# Patient Record
Sex: Female | Born: 1990 | Race: Black or African American | Hispanic: No | Marital: Single | Smoking: Current every day smoker
Health system: Southern US, Community
[De-identification: ages and names within clinical notes are randomized; demographics above are authoritative.]

## PROBLEM LIST (undated history)

## (undated) DIAGNOSIS — F329 Major depressive disorder, single episode, unspecified: Secondary | ICD-10-CM

## (undated) DIAGNOSIS — F32A Depression, unspecified: Secondary | ICD-10-CM

## (undated) DIAGNOSIS — T8859XA Other complications of anesthesia, initial encounter: Secondary | ICD-10-CM

## (undated) DIAGNOSIS — T4145XA Adverse effect of unspecified anesthetic, initial encounter: Secondary | ICD-10-CM

## (undated) DIAGNOSIS — B009 Herpesviral infection, unspecified: Secondary | ICD-10-CM

## (undated) DIAGNOSIS — Z8619 Personal history of other infectious and parasitic diseases: Secondary | ICD-10-CM

## (undated) HISTORY — DX: Personal history of other infectious and parasitic diseases: Z86.19

## (undated) HISTORY — PX: WISDOM TOOTH EXTRACTION: SHX21

---
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
## 2009-07-12 ENCOUNTER — Emergency Department (HOSPITAL_COMMUNITY): Admission: EM | Admit: 2009-07-12 | Discharge: 2009-07-12 | Payer: Self-pay | Admitting: Emergency Medicine

## 2009-07-24 ENCOUNTER — Other Ambulatory Visit: Payer: Self-pay

## 2009-07-25 ENCOUNTER — Other Ambulatory Visit: Payer: Self-pay | Admitting: Emergency Medicine

## 2009-07-25 ENCOUNTER — Inpatient Hospital Stay (HOSPITAL_COMMUNITY): Admission: AD | Admit: 2009-07-25 | Discharge: 2009-07-25 | Payer: Self-pay | Admitting: Obstetrics & Gynecology

## 2009-07-25 ENCOUNTER — Ambulatory Visit: Payer: Self-pay | Admitting: Obstetrics and Gynecology

## 2009-08-22 ENCOUNTER — Other Ambulatory Visit: Payer: Self-pay | Admitting: Emergency Medicine

## 2009-08-22 ENCOUNTER — Inpatient Hospital Stay (HOSPITAL_COMMUNITY): Admission: AD | Admit: 2009-08-22 | Discharge: 2009-08-22 | Payer: Self-pay | Admitting: Obstetrics & Gynecology

## 2009-08-22 ENCOUNTER — Ambulatory Visit: Payer: Self-pay | Admitting: Family Medicine

## 2009-09-06 ENCOUNTER — Emergency Department (HOSPITAL_COMMUNITY): Admission: EM | Admit: 2009-09-06 | Discharge: 2009-09-06 | Payer: Self-pay | Admitting: Emergency Medicine

## 2009-09-17 ENCOUNTER — Inpatient Hospital Stay (HOSPITAL_COMMUNITY): Admission: AD | Admit: 2009-09-17 | Discharge: 2009-09-17 | Payer: Self-pay | Admitting: Obstetrics and Gynecology

## 2009-10-05 ENCOUNTER — Inpatient Hospital Stay (HOSPITAL_COMMUNITY): Admission: AD | Admit: 2009-10-05 | Discharge: 2009-10-08 | Payer: Self-pay | Admitting: Family Medicine

## 2009-10-05 ENCOUNTER — Other Ambulatory Visit: Payer: Self-pay | Admitting: Emergency Medicine

## 2009-10-14 ENCOUNTER — Other Ambulatory Visit: Payer: Self-pay | Admitting: Emergency Medicine

## 2009-10-14 ENCOUNTER — Inpatient Hospital Stay (HOSPITAL_COMMUNITY): Admission: AD | Admit: 2009-10-14 | Discharge: 2009-10-16 | Payer: Self-pay | Admitting: Family Medicine

## 2009-10-14 ENCOUNTER — Ambulatory Visit: Payer: Self-pay | Admitting: Family Medicine

## 2010-06-03 ENCOUNTER — Emergency Department (HOSPITAL_COMMUNITY)
Admission: EM | Admit: 2010-06-03 | Discharge: 2010-06-03 | Payer: Self-pay | Source: Home / Self Care | Admitting: Emergency Medicine

## 2010-06-12 ENCOUNTER — Emergency Department (HOSPITAL_COMMUNITY)
Admission: EM | Admit: 2010-06-12 | Discharge: 2010-06-12 | Payer: Self-pay | Source: Home / Self Care | Admitting: Emergency Medicine

## 2010-06-13 LAB — URINALYSIS, ROUTINE W REFLEX MICROSCOPIC
Bilirubin Urine: NEGATIVE
Hgb urine dipstick: NEGATIVE
Ketones, ur: NEGATIVE mg/dL
Leukocytes, UA: NEGATIVE
Nitrite: NEGATIVE
Protein, ur: 100 mg/dL — AB
Specific Gravity, Urine: >1.03 — ABNORMAL HIGH (ref 1.005–1.030)
Urine Glucose, Fasting: NEGATIVE mg/dL
Urobilinogen, UA: 0.2 mg/dL (ref 0.0–1.0)
pH: 6 (ref 5.0–8.0)

## 2010-06-13 LAB — URINE MICROSCOPIC-ADD ON

## 2010-06-13 LAB — PREGNANCY, URINE: Preg Test, Ur: NEGATIVE

## 2010-08-06 LAB — CBC
HCT: 28 % — ABNORMAL LOW (ref 36.0–46.0)
HCT: 28.9 % — ABNORMAL LOW (ref 36.0–46.0)
Hemoglobin: 9.4 g/dL — ABNORMAL LOW (ref 12.0–15.0)
Hemoglobin: 9.9 g/dL — ABNORMAL LOW (ref 12.0–15.0)
MCHC: 35.7 g/dL (ref 30.0–36.0)
MCV: 93.6 fL (ref 78.0–100.0)
Platelets: 147 10*3/uL — ABNORMAL LOW (ref 150–400)
Platelets: 154 10*3/uL (ref 150–400)
Platelets: 311 10*3/uL (ref 150–400)
RBC: 3.28 MIL/uL — ABNORMAL LOW (ref 3.87–5.11)
RBC: 2.59 MIL/uL — ABNORMAL LOW (ref 3.87–5.11)
RBC: 3 MIL/uL — ABNORMAL LOW (ref 3.87–5.11)
RDW: 15.9 % — ABNORMAL HIGH (ref 11.5–15.5)
RDW: 16.5 % — ABNORMAL HIGH (ref 11.5–15.5)
WBC: 12.2 10*3/uL — ABNORMAL HIGH (ref 4.0–10.5)
WBC: 11.4 10*3/uL — ABNORMAL HIGH (ref 4.0–10.5)
WBC: 14.1 10*3/uL — ABNORMAL HIGH (ref 4.0–10.5)
WBC: 9.8 10*3/uL (ref 4.0–10.5)

## 2010-08-06 LAB — URINE MICROSCOPIC-ADD ON

## 2010-08-06 LAB — COMPREHENSIVE METABOLIC PANEL
ALT: 61 U/L — ABNORMAL HIGH (ref 0–35)
ALT: 41 U/L — ABNORMAL HIGH (ref 0–35)
ALT: 49 U/L — ABNORMAL HIGH (ref 0–35)
AST: 59 U/L — ABNORMAL HIGH (ref 0–37)
AST: 37 U/L (ref 0–37)
AST: 51 U/L — ABNORMAL HIGH (ref 0–37)
Albumin: 3.2 g/dL — ABNORMAL LOW (ref 3.5–5.2)
Albumin: 2.5 g/dL — ABNORMAL LOW (ref 3.5–5.2)
Alkaline Phosphatase: 102 U/L (ref 39–117)
Alkaline Phosphatase: 104 U/L (ref 39–117)
CO2: 24 mEq/L (ref 19–32)
CO2: 21 mEq/L (ref 19–32)
Calcium: 7 mg/dL — ABNORMAL LOW (ref 8.4–10.5)
Chloride: 110 mEq/L (ref 96–112)
Chloride: 108 mEq/L (ref 96–112)
Creatinine, Ser: 0.6 mg/dL (ref 0.4–1.2)
GFR calc Af Amer: >60 mL/min (ref >60)
GFR calc Af Amer: >60 mL/min (ref >60)
GFR calc Af Amer: >60 mL/min (ref >60)
GFR calc non Af Amer: >60 mL/min (ref >60)
GFR calc non Af Amer: >60 mL/min (ref >60)
Potassium: 4 mEq/L (ref 3.5–5.1)
Potassium: 3.6 mEq/L (ref 3.5–5.1)
Potassium: 3.6 mEq/L (ref 3.5–5.1)
Sodium: 139 mEq/L (ref 135–145)
Sodium: 137 mEq/L (ref 135–145)
Total Bilirubin: 0.7 mg/dL (ref 0.3–1.2)
Total Bilirubin: 0.5 mg/dL (ref 0.3–1.2)
Total Protein: 5.6 g/dL — ABNORMAL LOW (ref 6.0–8.3)

## 2010-08-06 LAB — URINALYSIS, ROUTINE W REFLEX MICROSCOPIC
Glucose, UA: NEGATIVE mg/dL
Glucose, UA: NEGATIVE mg/dL
Hgb urine dipstick: NEGATIVE
Ketones, ur: NEGATIVE mg/dL
Protein, ur: 100 mg/dL — AB
Protein, ur: 30 mg/dL — AB
Specific Gravity, Urine: >1.03 — ABNORMAL HIGH (ref 1.005–1.030)
pH: 6 (ref 5.0–8.0)
pH: 7 (ref 5.0–8.0)

## 2010-08-07 LAB — URINALYSIS, ROUTINE W REFLEX MICROSCOPIC
Glucose, UA: NEGATIVE mg/dL
Hgb urine dipstick: NEGATIVE
Protein, ur: NEGATIVE mg/dL
pH: 7 (ref 5.0–8.0)

## 2010-08-07 LAB — WET PREP, GENITAL
Trich, Wet Prep: NONE SEEN
Yeast Wet Prep HPF POC: NONE SEEN

## 2010-08-08 LAB — COMPREHENSIVE METABOLIC PANEL
ALT: 10 U/L (ref 0–35)
BUN: 6 mg/dL (ref 6–23)
CO2: 24 mEq/L (ref 19–32)
Calcium: 9.3 mg/dL (ref 8.4–10.5)
Creatinine, Ser: 0.5 mg/dL (ref 0.4–1.2)
GFR calc non Af Amer: >60 mL/min (ref >60)
Glucose, Bld: 84 mg/dL (ref 70–99)
Total Protein: 6.2 g/dL (ref 6.0–8.3)

## 2010-08-08 LAB — GC/CHLAMYDIA PROBE AMP, GENITAL
Chlamydia, DNA Probe: NEGATIVE
GC Probe Amp, Genital: NEGATIVE

## 2010-08-08 LAB — URINE CULTURE: Colony Count: 15000

## 2010-08-08 LAB — URINALYSIS, ROUTINE W REFLEX MICROSCOPIC
Glucose, UA: NEGATIVE mg/dL
Hgb urine dipstick: NEGATIVE
Ketones, ur: NEGATIVE mg/dL
Leukocytes, UA: NEGATIVE
Urobilinogen, UA: 0.2 mg/dL (ref 0.0–1.0)

## 2010-08-08 LAB — CBC
Hemoglobin: 10.6 g/dL — ABNORMAL LOW (ref 12.0–15.0)
MCHC: 35.2 g/dL (ref 30.0–36.0)
MCV: 94.1 fL (ref 78.0–100.0)
RBC: 3.2 MIL/uL — ABNORMAL LOW (ref 3.87–5.11)
RDW: 14.2 % (ref 11.5–15.5)

## 2010-08-08 LAB — RAPID STREP SCREEN (MED CTR MEBANE ONLY): Streptococcus, Group A Screen (Direct): NEGATIVE

## 2010-08-08 LAB — DIFFERENTIAL
Eosinophils Absolute: 0 10*3/uL (ref 0.0–0.7)
Lymphocytes Relative: 17 % (ref 12–46)
Lymphs Abs: 1.9 10*3/uL (ref 0.7–4.0)
Monocytes Relative: 5 % (ref 3–12)
Neutro Abs: 8.5 10*3/uL — ABNORMAL HIGH (ref 1.7–7.7)
Neutrophils Relative %: 77 % (ref 43–77)

## 2010-08-08 LAB — URINE MICROSCOPIC-ADD ON

## 2010-08-08 LAB — WET PREP, GENITAL

## 2010-08-13 LAB — GC/CHLAMYDIA PROBE AMP, GENITAL
Chlamydia, DNA Probe: NEGATIVE
GC Probe Amp, Genital: NEGATIVE

## 2010-08-13 LAB — WET PREP, GENITAL: Yeast Wet Prep HPF POC: NONE SEEN

## 2010-08-13 LAB — URINALYSIS, ROUTINE W REFLEX MICROSCOPIC
Hgb urine dipstick: NEGATIVE
Nitrite: NEGATIVE
Protein, ur: NEGATIVE mg/dL
Urobilinogen, UA: 1 mg/dL (ref 0.0–1.0)

## 2010-08-27 LAB — GC/CHLAMYDIA PROBE AMP, GENITAL: Chlamydia, DNA Probe: NEGATIVE

## 2010-08-27 LAB — DIFFERENTIAL
Lymphocytes Relative: 42 % (ref 12–46)
Lymphs Abs: 3.8 10*3/uL (ref 0.7–4.0)
Monocytes Relative: 6 % (ref 3–12)
Neutro Abs: 4.7 10*3/uL (ref 1.7–7.7)
Neutrophils Relative %: 52 % (ref 43–77)

## 2010-08-27 LAB — URINALYSIS, ROUTINE W REFLEX MICROSCOPIC
Bilirubin Urine: NEGATIVE
Glucose, UA: NEGATIVE mg/dL
Hgb urine dipstick: NEGATIVE
Protein, ur: NEGATIVE mg/dL
Specific Gravity, Urine: >1.03 — ABNORMAL HIGH (ref 1.005–1.030)
Specific Gravity, Urine: >1.03 — ABNORMAL HIGH (ref 1.005–1.030)
Urobilinogen, UA: 0.2 mg/dL (ref 0.0–1.0)
Urobilinogen, UA: 0.2 mg/dL (ref 0.0–1.0)

## 2010-08-27 LAB — WET PREP, GENITAL
WBC, Wet Prep HPF POC: NONE SEEN
Yeast Wet Prep HPF POC: NONE SEEN

## 2010-08-27 LAB — BASIC METABOLIC PANEL
Calcium: 9.2 mg/dL (ref 8.4–10.5)
Creatinine, Ser: 0.73 mg/dL (ref 0.4–1.2)
GFR calc Af Amer: >60 mL/min (ref >60)

## 2010-08-27 LAB — CBC
RBC: 3.66 MIL/uL — ABNORMAL LOW (ref 3.87–5.11)
WBC: 9.1 10*3/uL (ref 4.0–10.5)

## 2010-08-29 LAB — URINALYSIS, ROUTINE W REFLEX MICROSCOPIC
Glucose, UA: NEGATIVE mg/dL
Hgb urine dipstick: NEGATIVE
Protein, ur: NEGATIVE mg/dL
Specific Gravity, Urine: 1.025 (ref 1.005–1.030)
pH: 6 (ref 5.0–8.0)

## 2010-08-29 LAB — HERPES SIMPLEX VIRUS CULTURE

## 2010-08-29 LAB — WET PREP, GENITAL
Trich, Wet Prep: NONE SEEN
Yeast Wet Prep HPF POC: NONE SEEN

## 2010-11-17 ENCOUNTER — Emergency Department (HOSPITAL_COMMUNITY): Payer: Self-pay

## 2010-11-17 ENCOUNTER — Emergency Department (HOSPITAL_COMMUNITY)
Admission: EM | Admit: 2010-11-17 | Discharge: 2010-11-17 | Disposition: A | Discharge status: Home / Self Care | Payer: Self-pay | Attending: Emergency Medicine | Admitting: Emergency Medicine

## 2010-11-17 DIAGNOSIS — S1093XA Contusion of unspecified part of neck, initial encounter: Secondary | ICD-10-CM | POA: Insufficient documentation

## 2010-11-17 DIAGNOSIS — S0990XA Unspecified injury of head, initial encounter: Secondary | ICD-10-CM | POA: Insufficient documentation

## 2010-11-17 DIAGNOSIS — S0003XA Contusion of scalp, initial encounter: Secondary | ICD-10-CM | POA: Insufficient documentation

## 2010-11-19 ENCOUNTER — Emergency Department (HOSPITAL_COMMUNITY)
Admission: EM | Admit: 2010-11-19 | Discharge: 2010-11-19 | Disposition: A | Discharge status: Home / Self Care | Payer: Self-pay | Attending: Emergency Medicine | Admitting: Emergency Medicine

## 2010-11-19 DIAGNOSIS — R51 Headache: Secondary | ICD-10-CM | POA: Insufficient documentation

## 2011-02-22 LAB — URINALYSIS, ROUTINE W REFLEX MICROSCOPIC
Ketones, ur: NEGATIVE mg/dL
Leukocytes, UA: NEGATIVE
Nitrite: NEGATIVE
pH: 5.5 (ref 5.0–8.0)

## 2011-02-22 LAB — URINE MICROSCOPIC-ADD ON

## 2011-02-22 LAB — PREGNANCY, URINE: Preg Test, Ur: NEGATIVE

## 2011-06-25 ENCOUNTER — Emergency Department (HOSPITAL_COMMUNITY)
Admission: EM | Admit: 2011-06-25 | Discharge: 2011-06-25 | Disposition: A | Discharge status: Home / Self Care | Payer: Self-pay | Attending: Emergency Medicine | Admitting: Emergency Medicine

## 2011-06-25 ENCOUNTER — Encounter (HOSPITAL_COMMUNITY): Payer: Self-pay | Admitting: *Deleted

## 2011-06-25 DIAGNOSIS — F17200 Nicotine dependence, unspecified, uncomplicated: Secondary | ICD-10-CM | POA: Insufficient documentation

## 2011-06-25 DIAGNOSIS — L309 Dermatitis, unspecified: Secondary | ICD-10-CM

## 2011-06-25 DIAGNOSIS — L259 Unspecified contact dermatitis, unspecified cause: Secondary | ICD-10-CM | POA: Insufficient documentation

## 2011-06-25 DIAGNOSIS — K649 Unspecified hemorrhoids: Secondary | ICD-10-CM | POA: Insufficient documentation

## 2011-06-25 HISTORY — DX: Herpesviral infection, unspecified: B00.9

## 2011-06-25 MED ORDER — HYDROCORTISONE ACETATE 25 MG RE SUPP: 25.0000 mg | Freq: Two times a day (BID) | RECTAL | Status: AC

## 2011-06-25 MED ORDER — HYDROXYZINE PAMOATE 25 MG PO CAPS: ORAL_CAPSULE | ORAL | Status: DC

## 2011-06-25 MED ORDER — TRIAMCINOLONE ACETONIDE 0.1 % EX CREA: TOPICAL_CREAM | Freq: Two times a day (BID) | CUTANEOUS | Status: DC

## 2011-06-25 MED ORDER — DEXAMETHASONE 4 MG PO TABS: ORAL_TABLET | ORAL | Status: AC

## 2011-06-25 NOTE — ED Notes (Signed)
Pt reports rectal pain x 2 days with hemorrhoids, reports hemorrhoids "busted" last night and has had bleeding since.  Also c/o ? Scabies, reports rash to hands for "a while" states son has been dx with same.

## 2011-06-25 NOTE — ED Notes (Signed)
Patient c/o rectal pain. Per patient hemorhroids x 2 days. Will wait to assess with EDPA

## 2011-07-11 NOTE — ED Provider Notes (Signed)
History     CSN: [id number]  Arrival date & time 06/25/11  4098   First MD Initiated Contact with Patient 06/25/11 [phone]      Chief Complaint  Patient presents with  . Rectal Pain  . Rash    (Consider location/radiation/quality/duration/timing/severity/associated sxs/prior treatment) HPI Comments: Patient presents to the emergency department with complaint of 2 days of rectal pain. The patient states he has had problems with hemorrhoids, and feels that one of them" busted" on last evening states he has had some bleeding from the rectal area since that time. The patient also complains of a rash on his hands that he has had for" a while" and he requested this be checked as well. It is of note that his son was diagnosed with" scabies".  Patient is a 20 y.o. female presenting with rash. The history is provided by the patient.  Rash     Past Medical History  Diagnosis Date  . Herpes     Past Surgical History  Procedure Date  . Wisdom tooth extraction     No family history on file.  History  Substance Use Topics  . Smoking status: Current Everyday Smoker    Types: Cigarettes  . Smokeless tobacco: Not on file  . Alcohol Use: No    OB History    Grav Para Term Preterm Abortions TAB SAB Ect Mult Living                  Review of Systems  Constitutional: Negative for activity change.       All ROS Neg except as noted in HPI  HENT: Negative for nosebleeds and neck pain.   Eyes: Negative for photophobia and discharge.  Respiratory: Negative for cough, shortness of breath and wheezing.   Cardiovascular: Negative for chest pain and palpitations.  Gastrointestinal: Positive for rectal pain. Negative for abdominal pain and blood in stool.  Genitourinary: Negative for dysuria, frequency and hematuria.  Musculoskeletal: Negative for back pain and arthralgias.  Skin: Positive for rash.  Neurological: Negative for dizziness, seizures and speech difficulty.    Psychiatric/Behavioral: Negative for hallucinations and confusion.    Allergies  Review of patient's allergies indicates no known allergies.  Home Medications   Current Outpatient Rx  Name Route Sig Dispense Refill  . HYDROCORTISONE 1 % EX CREA Topical Apply 1 application topically 2 (two) times daily.    Marland Kitchen LEVONORGESTREL 20 MCG/24HR IU IUD Intrauterine 1 each by Intrauterine route once.    Marland Kitchen HYDROXYZINE PAMOATE 25 MG PO CAPS  1 po qhs for itching 10 capsule 0  . TRIAMCINOLONE ACETONIDE 0.1 % EX CREA Topical Apply topically 2 (two) times daily. Apply to itching areas 2 times daily 45 g 0    BP 100/80  Pulse 67  Temp(Src) 97.9 F (36.6 C) (Oral)  Resp 16  Ht 5\' 1"  (1.549 m)  Wt 125 lb (56.7 kg)  BMI 23.62 kg/m2  SpO2 100%  Physical Exam  Nursing note and vitals reviewed. Constitutional: She is oriented to person, place, and time. She appears well-developed and well-nourished.  Non-toxic appearance.  HENT:  Head: Normocephalic.  Right Ear: Tympanic membrane and external ear normal.  Left Ear: Tympanic membrane and external ear normal.  Eyes: EOM and lids are normal. Pupils are equal, round, and reactive to light.  Neck: Normal range of motion. Neck supple. Carotid bruit is not present.  Cardiovascular: Normal rate, regular rhythm, normal heart sounds, intact distal pulses and normal pulses.  Pulmonary/Chest: Breath sounds normal. No respiratory distress.  Abdominal: Soft. Bowel sounds are normal. There is no tenderness. There is no guarding.  Genitourinary:       External hemorrhoid noted on rectal exam, With mild bleeding present. Good sphincter tone. No mass involving the sphincter or rectal vault.  Musculoskeletal: Normal range of motion.  Lymphadenopathy:       Head (right side): No submandibular adenopathy present.       Head (left side): No submandibular adenopathy present.    She has no cervical adenopathy.  Neurological: She is alert and oriented to person,  place, and time. She has normal strength. No cranial nerve deficit or sensory deficit.  Skin: Skin is warm and dry. Rash noted.       Rash noted to the hands. There are no lesions between the fingers or the web spaces. And there is no rash above the wrist.  Psychiatric: She has a normal mood and affect. Her speech is normal.    ED Course  Procedures (including critical care time)  Labs Reviewed - No data to display No results found.   1. Hemorrhoids   2. Eczema       MDM  I have reviewed nursing notes, vital signs, and all appropriate lab and imaging results for this patient.  Rx for vistaril and Kenalog given to pt. Pt is use warm tub soaks for his hemorrhoid, No evidence of thrombosis noted.      Kathie Dike, [place] 07/11/11 [phone]

## 2011-07-12 NOTE — ED Provider Notes (Signed)
Medical screening examination/treatment/procedure(s) were performed by non-physician practitioner and as supervising physician I was immediately available for consultation/collaboration.   Laray Anger, DO 07/12/11 1333

## 2011-09-17 ENCOUNTER — Emergency Department (HOSPITAL_COMMUNITY)
Admission: EM | Admit: 2011-09-17 | Discharge: 2011-09-17 | Disposition: A | Discharge status: Home / Self Care | Payer: Self-pay | Attending: Emergency Medicine | Admitting: Emergency Medicine

## 2011-09-17 ENCOUNTER — Encounter (HOSPITAL_COMMUNITY): Payer: Self-pay | Admitting: *Deleted

## 2011-09-17 DIAGNOSIS — Z202 Contact with and (suspected) exposure to infections with a predominantly sexual mode of transmission: Secondary | ICD-10-CM | POA: Insufficient documentation

## 2011-09-17 DIAGNOSIS — B009 Herpesviral infection, unspecified: Secondary | ICD-10-CM | POA: Insufficient documentation

## 2011-09-17 LAB — URINALYSIS, ROUTINE W REFLEX MICROSCOPIC
Glucose, UA: NEGATIVE mg/dL
Leukocytes, UA: NEGATIVE
Protein, ur: NEGATIVE mg/dL
Specific Gravity, Urine: 1.015 (ref 1.005–1.030)
Urobilinogen, UA: 0.2 mg/dL (ref 0.0–1.0)

## 2011-09-17 LAB — POCT PREGNANCY, URINE: Preg Test, Ur: NEGATIVE

## 2011-09-17 LAB — WET PREP, GENITAL: Trich, Wet Prep: NONE SEEN

## 2011-09-17 MED ORDER — CEFTRIAXONE SODIUM 250 MG IJ SOLR
250.0000 mg | Freq: Once | INTRAMUSCULAR | Status: AC
  Administered 2011-09-17: 250 mg via INTRAMUSCULAR
  Filled 2011-09-17: qty 250

## 2011-09-17 MED ORDER — AZITHROMYCIN 250 MG PO TABS
1000.0000 mg | ORAL_TABLET | Freq: Once | ORAL | Status: AC
  Administered 2011-09-17: 1000 mg via ORAL
  Filled 2011-09-17: qty 4

## 2011-09-17 MED ORDER — METRONIDAZOLE 500 MG PO TABS: 500.0000 mg | ORAL_TABLET | Freq: Two times a day (BID) | ORAL | Status: AC

## 2011-09-17 MED ORDER — LIDOCAINE HCL (PF) 1 % IJ SOLN
INTRAMUSCULAR | Status: AC
  Administered 2011-09-17: 22:00:00
  Filled 2011-09-17: qty 5

## 2011-09-17 NOTE — ED Notes (Signed)
Exposed to std.

## 2011-09-17 NOTE — ED Notes (Signed)
Pt presents with need to to tested for STD's. Pt reports ex-boyfriend confessed his infidelity today and his positive test results for std's. Pt denies vaginal discharge,odor and abdominal discomfort. Pt is very upset and anxious for test results.

## 2011-09-20 NOTE — ED Provider Notes (Signed)
History     CSN: [id number]  Arrival date & time 09/17/11  4098   First MD Initiated Contact with Patient 09/17/11 2003      Chief Complaint  Patient presents with  . SEXUALLY TRANSMITTED DISEASE    (Consider location/radiation/quality/duration/timing/severity/associated sxs/prior treatment) HPI Comments: Patient is requesting STD check.  She states that her recent sexual partner has been treated for an STD.  She is unsure of which one , but thinks he has chlamydia.  Patient denies any symptoms at this time.  States her last sexual encounter with him was two weeks ago.    Patient is a 21 y.o. female presenting with STD exposure. The history is provided by the patient.  Exposure to STD This is a new problem. The current episode started today. Pertinent negatives include no abdominal pain, arthralgias, chills, fever, myalgias, nausea, numbness, rash, sore throat, swollen glands, urinary symptoms, vomiting or weakness. The symptoms are aggravated by nothing. She has tried nothing for the symptoms.    Past Medical History  Diagnosis Date  . Herpes     Past Surgical History  Procedure Date  . Wisdom tooth extraction     No family history on file.  History  Substance Use Topics  . Smoking status: Current Everyday Smoker    Types: Cigarettes  . Smokeless tobacco: Not on file  . Alcohol Use: No    OB History    Grav Para Term Preterm Abortions TAB SAB Ect Mult Living                  Review of Systems  Constitutional: Negative for fever and chills.  HENT: Negative for sore throat and trouble swallowing.   Respiratory: Negative for shortness of breath.   Gastrointestinal: Negative for nausea, vomiting and abdominal pain.  Genitourinary: Negative for dysuria, frequency, hematuria, flank pain, decreased urine volume, vaginal bleeding, vaginal discharge, difficulty urinating, menstrual problem and pelvic pain.  Musculoskeletal: Negative for myalgias, back pain and  arthralgias.  Skin: Negative for rash.  Neurological: Negative for weakness and numbness.  Hematological: Does not bruise/bleed easily.  All other systems reviewed and are negative.    Allergies  Review of patient's allergies indicates no known allergies.  Home Medications   Current Outpatient Rx  Name Route Sig Dispense Refill  . LEVONORGESTREL 20 MCG/24HR IU IUD Intrauterine 1 each by Intrauterine route once.    Marland Kitchen METRONIDAZOLE 500 MG PO TABS Oral Take 1 tablet (500 mg total) by mouth 2 (two) times daily. For 7 days 14 tablet 0    BP 131/65  Pulse 63  Temp 98.1 F (36.7 C)  Resp 20  Ht 5\' 1"  (1.549 m)  Wt 126 lb (57.153 kg)  BMI 23.81 kg/m2  SpO2 99%  Physical Exam  Nursing note and vitals reviewed. Constitutional: She is oriented to person, place, and time. She appears well-developed and well-nourished. No distress.  Cardiovascular: Normal rate, regular rhythm and normal heart sounds.   Pulmonary/Chest: Effort normal and breath sounds normal.  Abdominal: Soft. Bowel sounds are normal. She exhibits no distension and no mass. There is no tenderness. There is no rebound and no guarding.  Genitourinary: Uterus normal. Cervix exhibits no motion tenderness, no discharge and no friability. Right adnexum displays no mass and no tenderness. Left adnexum displays no mass and no tenderness. No tenderness or bleeding around the vagina. No foreign body around the vagina. No vaginal discharge found.       IUD is in place.  Musculoskeletal: Normal range of motion.  Neurological: She is alert and oriented to person, place, and time. She exhibits normal muscle tone. Coordination normal.  Skin: Skin is warm and dry.    ED Course  Procedures (including critical care time)  Labs Reviewed  GC/CHLAMYDIA PROBE AMP, GENITAL - Abnormal; Notable for the following:    GC Probe Amp, Genital POSITIVE (*)    Chlamydia, DNA Probe POSITIVE (*)    All other components within normal limits  WET  PREP, GENITAL - Abnormal; Notable for the following:    Clue Cells Wet Prep HPF POC MODERATE (*)    WBC, Wet Prep HPF POC MANY (*)    All other components within normal limits  URINALYSIS, ROUTINE W REFLEX MICROSCOPIC - Abnormal; Notable for the following:    pH 8.5 (*)    All other components within normal limits  POCT PREGNANCY, URINE     1. Exposure to STD       MDM    Previous medical charts, nursing notes and vitals signs from this visit were reviewed by me   All laboratory results and/or imaging results performed on this visit, if applicable, were reviewed by me and discussed with the patient and/or parent as well as recommendation for follow-up    MEDICATIONS GIVEN IN ED: po zithromax, IM rocephin   Patient is non-toxic appearing.  abd is soft , NT.  Benign pelvic exam.  Will treat for possible STD exposure.  Patient agrees to f/u with health dept       PRESCRIPTIONS GIVEN AT DISCHARGE:  flagyl    Pt stable in ED with no significant deterioration in condition. Pt feels improved after observation and/or treatment in ED. Patient / Family / Caregiver understand and agree with initial ED impression and plan with expectations set for ED visit.  Patient agrees to return to ED for any worsening symptoms          Egor Fullilove L. [place], [place] 09/20/11 [phone]

## 2011-09-20 NOTE — ED Notes (Signed)
Results received from [facility].  (+) for Gonorrhea & Chlamydia.  Treated with 1000 mg po Zithromax and 250 mg IM Rocephin.  [facility] form completed and faxed.  Call and notify patientt.

## 2011-09-21 NOTE — ED Provider Notes (Signed)
Medical screening examination/treatment/procedure(s) were performed by non-physician practitioner and as supervising physician I was immediately available for consultation/collaboration.  Nicoletta Dress. Colon Branch, MD 09/21/11 [phone]

## 2011-09-21 NOTE — ED Notes (Signed)
Attempt to notifiy patient unsuccessful.  Left message to call flow manager's #

## 2011-09-22 NOTE — ED Notes (Signed)
Attempted to call patient. No answer. Left message for patient to call back.

## 2012-05-20 ENCOUNTER — Emergency Department (HOSPITAL_COMMUNITY)
Admission: EM | Admit: 2012-05-20 | Discharge: 2012-05-20 | Disposition: A | Discharge status: Home / Self Care | Payer: Self-pay | Attending: Emergency Medicine | Admitting: Emergency Medicine

## 2012-05-20 ENCOUNTER — Encounter (HOSPITAL_COMMUNITY): Payer: Self-pay

## 2012-05-20 DIAGNOSIS — K5289 Other specified noninfective gastroenteritis and colitis: Secondary | ICD-10-CM | POA: Insufficient documentation

## 2012-05-20 DIAGNOSIS — R197 Diarrhea, unspecified: Secondary | ICD-10-CM | POA: Insufficient documentation

## 2012-05-20 DIAGNOSIS — Z79899 Other long term (current) drug therapy: Secondary | ICD-10-CM | POA: Insufficient documentation

## 2012-05-20 DIAGNOSIS — F17200 Nicotine dependence, unspecified, uncomplicated: Secondary | ICD-10-CM | POA: Insufficient documentation

## 2012-05-20 DIAGNOSIS — Z3202 Encounter for pregnancy test, result negative: Secondary | ICD-10-CM | POA: Insufficient documentation

## 2012-05-20 DIAGNOSIS — Z8619 Personal history of other infectious and parasitic diseases: Secondary | ICD-10-CM | POA: Insufficient documentation

## 2012-05-20 DIAGNOSIS — K529 Noninfective gastroenteritis and colitis, unspecified: Secondary | ICD-10-CM

## 2012-05-20 DIAGNOSIS — B379 Candidiasis, unspecified: Secondary | ICD-10-CM | POA: Insufficient documentation

## 2012-05-20 LAB — URINE MICROSCOPIC-ADD ON

## 2012-05-20 LAB — URINALYSIS, ROUTINE W REFLEX MICROSCOPIC
Bilirubin Urine: NEGATIVE
Ketones, ur: NEGATIVE mg/dL
Leukocytes, UA: NEGATIVE

## 2012-05-20 LAB — PREGNANCY, URINE: Preg Test, Ur: NEGATIVE

## 2012-05-20 MED ORDER — HYDROCORTISONE 1 % EX CREA: TOPICAL_CREAM | CUTANEOUS | Status: DC

## 2012-05-20 MED ORDER — PROMETHAZINE HCL 25 MG PO TABS: 25.0000 mg | ORAL_TABLET | Freq: Four times a day (QID) | ORAL | Status: DC | PRN

## 2012-05-20 NOTE — ED Notes (Signed)
Dr Zammit at bedside. 

## 2012-05-20 NOTE — ED Provider Notes (Signed)
History     CSN: [id number]  Arrival date & time 05/20/12  0102   First MD Initiated Contact with Patient 05/20/12 1305      Chief Complaint  Patient presents with  . Nausea  . Diarrhea    (Consider location/radiation/quality/duration/timing/severity/associated sxs/prior treatment) Patient is a 22 y.o. female presenting with diarrhea. The history is provided by the patient (the pt complains of nauseau and diarhea with vaginal itching). No language interpreter was used.  Diarrhea The primary symptoms include diarrhea. Primary symptoms do not include fatigue, abdominal pain or rash. The illness began today. The problem has not changed since onset. The illness does not include chills or back pain. Associated medical issues do not include inflammatory bowel disease. Risk factors: nothing.    Past Medical History  Diagnosis Date  . Herpes     Past Surgical History  Procedure Date  . Wisdom tooth extraction     No family history on file.  History  Substance Use Topics  . Smoking status: Current Every Day Smoker    Types: Cigarettes  . Smokeless tobacco: Not on file  . Alcohol Use: No    OB History    Grav Para Term Preterm Abortions TAB SAB Ect Mult Living                  Review of Systems  Constitutional: Negative for chills and fatigue.  HENT: Negative for congestion, sinus pressure and ear discharge.   Eyes: Negative for discharge.  Respiratory: Negative for cough.   Cardiovascular: Negative for chest pain.  Gastrointestinal: Positive for diarrhea. Negative for abdominal pain.  Genitourinary: Negative for frequency and hematuria.  Musculoskeletal: Negative for back pain.  Skin: Negative for rash.  Neurological: Negative for seizures and headaches.  Hematological: Negative.   Psychiatric/Behavioral: Negative for hallucinations.    Allergies  Review of patient's allergies indicates no known allergies.  Home Medications   Current Outpatient Rx  Name   Route  Sig  Dispense  Refill  . LEVONORGESTREL 20 MCG/24HR IU IUD   Intrauterine   1 each by Intrauterine route once.         Marland Kitchen HYDROCORTISONE 1 % EX CREA      Apply to affected area 2 times daily   15 g   0   . PROMETHAZINE HCL 25 MG PO TABS   Oral   Take 1 tablet (25 mg total) by mouth every 6 (six) hours as needed for nausea.   15 tablet   0     BP 120/69  Pulse 64  Temp 98.2 F (36.8 C) (Oral)  Resp 20  Ht 5\' 1"  (1.549 m)  Wt 125 lb (56.7 kg)  BMI 23.62 kg/m2  SpO2 100%  Physical Exam  Constitutional: She is oriented to person, place, and time. She appears well-developed.  HENT:  Head: Normocephalic and atraumatic.  Eyes: Conjunctivae normal and EOM are normal. No scleral icterus.  Neck: Neck supple. No thyromegaly present.  Cardiovascular: Normal rate and regular rhythm.  Exam reveals no gallop and no friction rub.   No murmur heard. Pulmonary/Chest: No stridor. She has no wheezes. She has no rales. She exhibits no tenderness.  Abdominal: She exhibits no distension. There is no tenderness. There is no rebound.  Genitourinary: Vaginal discharge found.  Musculoskeletal: Normal range of motion. She exhibits no edema.  Lymphadenopathy:    She has no cervical adenopathy.  Neurological: She is oriented to person, place, and time. Coordination normal.  Skin:  No rash noted. No erythema.  Psychiatric: She has a normal mood and affect. Her behavior is normal.    ED Course  Procedures (including critical care time)  Labs Reviewed  URINALYSIS, ROUTINE W REFLEX MICROSCOPIC - Abnormal; Notable for the following:    Specific Gravity, Urine <1.005 (*)     Hgb urine dipstick TRACE (*)     All other components within normal limits  PREGNANCY, URINE  URINE MICROSCOPIC-ADD ON   No results found.   1. Gastroenteritis   2. Yeast infection       MDM          Benny Lennert, MD 05/20/12 1339

## 2012-05-20 NOTE — ED Notes (Signed)
Pt reports nausea and diarrhea x 2 days.  Also reports has herpes and thinks is having an outbreak.  C/O vaginal itching.

## 2012-08-23 ENCOUNTER — Emergency Department (HOSPITAL_COMMUNITY)
Admission: EM | Admit: 2012-08-23 | Discharge: 2012-08-23 | Disposition: A | Discharge status: Home / Self Care | Payer: Self-pay | Attending: Emergency Medicine | Admitting: Emergency Medicine

## 2012-08-23 ENCOUNTER — Encounter (HOSPITAL_COMMUNITY): Payer: Self-pay | Admitting: *Deleted

## 2012-08-23 DIAGNOSIS — Z79899 Other long term (current) drug therapy: Secondary | ICD-10-CM | POA: Insufficient documentation

## 2012-08-23 DIAGNOSIS — F17200 Nicotine dependence, unspecified, uncomplicated: Secondary | ICD-10-CM | POA: Insufficient documentation

## 2012-08-23 DIAGNOSIS — K2970 Gastritis, unspecified, without bleeding: Secondary | ICD-10-CM | POA: Insufficient documentation

## 2012-08-23 DIAGNOSIS — R112 Nausea with vomiting, unspecified: Secondary | ICD-10-CM | POA: Insufficient documentation

## 2012-08-23 DIAGNOSIS — K2990 Gastroduodenitis, unspecified, without bleeding: Secondary | ICD-10-CM | POA: Insufficient documentation

## 2012-08-23 DIAGNOSIS — Z8619 Personal history of other infectious and parasitic diseases: Secondary | ICD-10-CM | POA: Insufficient documentation

## 2012-08-23 MED ORDER — GI COCKTAIL ~~LOC~~
30.0000 mL | Freq: Once | ORAL | Status: AC
  Administered 2012-08-23: 30 mL via ORAL
  Filled 2012-08-23: qty 30

## 2012-08-23 NOTE — ED Provider Notes (Signed)
History     CSN: [id number]  Arrival date & time 08/23/12  4098   First MD Initiated Contact with Patient 08/23/12 [phone]      Chief Complaint  Patient presents with  . Abdominal Pain  . Nausea  . Emesis    (Consider location/radiation/quality/duration/timing/severity/associated sxs/prior treatment) HPI Comments: Alexandria Bradshaw is a 22 y.o. Female who states that she developed upper abdominal pain about an hour ago. She feels nauseated and has vomited some. Since arriving to the ED, her pain has improved. She denies recent anorexia, fever, chills, diarrhea, constipation, or urinary tract problems. She's not had this problem previously. There are no known modifying factors.  Patient is a 22 y.o. female presenting with abdominal pain and vomiting. The history is provided by the patient.  Abdominal Pain Associated symptoms: vomiting   Emesis Associated symptoms: abdominal pain     Past Medical History  Diagnosis Date  . Herpes     Past Surgical History  Procedure Laterality Date  . Wisdom tooth extraction      History reviewed. No pertinent family history.  History  Substance Use Topics  . Smoking status: Current Every Day Smoker    Types: Cigarettes  . Smokeless tobacco: Not on file  . Alcohol Use: No    OB History   Grav Para Term Preterm Abortions TAB SAB Ect Mult Living                  Review of Systems  Gastrointestinal: Positive for vomiting and abdominal pain.  All other systems reviewed and are negative.    Allergies  Review of patient's allergies indicates no known allergies.  Home Medications   Current Outpatient Rx  Name  Route  Sig  Dispense  Refill  . hydrocortisone cream 1 %      Apply to affected area 2 times daily   15 g   0   . promethazine (PHENERGAN) 25 MG tablet   Oral   Take 1 tablet (25 mg total) by mouth every 6 (six) hours as needed for nausea.   15 tablet   0   . levonorgestrel (MIRENA) 20 MCG/24HR IUD  Intrauterine   1 each by Intrauterine route once.           BP 132/81  Pulse 71  Temp(Src) 97.6 F (36.4 C) (Oral)  Resp 16  Ht 5\' 2"  (1.575 m)  Wt 125 lb (56.7 kg)  BMI 22.86 kg/m2  SpO2 100%  Physical Exam  Nursing note and vitals reviewed. Constitutional: She is oriented to person, place, and time. She appears well-developed and well-nourished. No distress.  HENT:  Head: Normocephalic and atraumatic.  Eyes: Conjunctivae and EOM are normal. Pupils are equal, round, and reactive to light.  Neck: Normal range of motion and phonation normal. Neck supple.  Cardiovascular: Normal rate, regular rhythm and intact distal pulses.   Pulmonary/Chest: Effort normal and breath sounds normal. She exhibits no tenderness.  Abdominal: Soft. She exhibits no distension. There is tenderness (Epigastric, mild). There is no guarding.  Musculoskeletal: Normal range of motion.  Neurological: She is alert and oriented to person, place, and time. She has normal strength. She exhibits normal muscle tone.  Skin: Skin is warm and dry.  Psychiatric: She has a normal mood and affect. Her behavior is normal. Judgment and thought content normal.    ED Course  Procedures (including critical care time) Medications  gi cocktail (Maalox,Lidocaine,Donnatal) (30 mLs Oral Given 08/23/12 4782)   Patient  Vitals for the past 24 hrs:  BP Temp Temp src Pulse Resp SpO2 Height Weight  08/23/12 0530 132/81 mmHg 97.6 F (36.4 C) Oral 71 16 100 % 5\' 2"  (1.575 m) 125 lb (56.7 kg)   Nursing Notes Reviewed/ Care Coordinated, and agree without changes. Applicable Imaging Reviewed.  Interpretation of Laboratory Data incorporated into ED treatment  06:28- she has had complete resolution of her discomfort     1. Gastritis       MDM    Evaluation consistent with nonspecific gastritis. She is improved with treatment  for an acid condition. Doubt metabolic instability, serious bacterial infection or impending  vascular collapse; the patient is stable for discharge.    Plan: Home Medications- Maalox prn; Home Treatments- rest; Recommended follow up- PCP prn      Flint Melter, MD 08/23/12 [phone]

## 2012-08-23 NOTE — ED Notes (Signed)
Pt c/o mid upper quadrant abdominal pain with n/v.

## 2012-09-22 ENCOUNTER — Telehealth: Payer: Self-pay | Admitting: Advanced Practice Midwife

## 2012-09-22 ENCOUNTER — Encounter: Payer: Self-pay | Admitting: *Deleted

## 2012-09-22 DIAGNOSIS — B009 Herpesviral infection, unspecified: Secondary | ICD-10-CM | POA: Insufficient documentation

## 2012-09-22 NOTE — Telephone Encounter (Signed)
Pt states has had IUD x 3 years has not had a period for 3 years and started bleeding this month. Appointment made tomorrow with Cyril Mourning, NP

## 2012-09-23 ENCOUNTER — Ambulatory Visit: Payer: Self-pay | Admitting: Adult Health

## 2012-10-08 ENCOUNTER — Ambulatory Visit: Payer: Self-pay | Admitting: Adult Health

## 2013-09-07 ENCOUNTER — Encounter (HOSPITAL_COMMUNITY): Payer: Self-pay | Admitting: Emergency Medicine

## 2013-09-07 ENCOUNTER — Emergency Department (HOSPITAL_COMMUNITY)
Admission: EM | Admit: 2013-09-07 | Discharge: 2013-09-08 | Disposition: A | Discharge status: Home / Self Care | Payer: Self-pay | Attending: Emergency Medicine | Admitting: Emergency Medicine

## 2013-09-07 DIAGNOSIS — F17200 Nicotine dependence, unspecified, uncomplicated: Secondary | ICD-10-CM | POA: Insufficient documentation

## 2013-09-07 DIAGNOSIS — Z8619 Personal history of other infectious and parasitic diseases: Secondary | ICD-10-CM | POA: Insufficient documentation

## 2013-09-07 DIAGNOSIS — IMO0002 Reserved for concepts with insufficient information to code with codable children: Secondary | ICD-10-CM | POA: Insufficient documentation

## 2013-09-07 DIAGNOSIS — F329 Major depressive disorder, single episode, unspecified: Secondary | ICD-10-CM | POA: Insufficient documentation

## 2013-09-07 DIAGNOSIS — F332 Major depressive disorder, recurrent severe without psychotic features: Secondary | ICD-10-CM

## 2013-09-07 DIAGNOSIS — R45851 Suicidal ideations: Secondary | ICD-10-CM

## 2013-09-07 DIAGNOSIS — Z3202 Encounter for pregnancy test, result negative: Secondary | ICD-10-CM | POA: Insufficient documentation

## 2013-09-07 DIAGNOSIS — Z79899 Other long term (current) drug therapy: Secondary | ICD-10-CM | POA: Insufficient documentation

## 2013-09-07 HISTORY — DX: Major depressive disorder, single episode, unspecified: F32.9

## 2013-09-07 HISTORY — DX: Depression, unspecified: F32.A

## 2013-09-07 LAB — CBC WITH DIFFERENTIAL/PLATELET
BASOS PCT: 0 % (ref 0–1)
Basophils Absolute: 0 10*3/uL (ref 0.0–0.1)
EOS ABS: 0 10*3/uL (ref 0.0–0.7)
Eosinophils Relative: 0 % (ref 0–5)
HCT: 38.6 % (ref 36.0–46.0)
Hemoglobin: 13.2 g/dL (ref 12.0–15.0)
LYMPHS ABS: 2.5 10*3/uL (ref 0.7–4.0)
Lymphocytes Relative: 19 % (ref 12–46)
MCH: 32.6 pg (ref 26.0–34.0)
MCHC: 34.2 g/dL (ref 30.0–36.0)
MCV: 95.3 fL (ref 78.0–100.0)
MONOS PCT: 6 % (ref 3–12)
Monocytes Absolute: 0.7 10*3/uL (ref 0.1–1.0)
NEUTROS PCT: 75 % (ref 43–77)
Neutro Abs: 9.7 10*3/uL — ABNORMAL HIGH (ref 1.7–7.7)
PLATELETS: 210 10*3/uL (ref 150–400)
RBC: 4.05 MIL/uL (ref 3.87–5.11)
RDW: 12.4 % (ref 11.5–15.5)
WBC: 12.9 10*3/uL — ABNORMAL HIGH (ref 4.0–10.5)

## 2013-09-07 LAB — URINALYSIS, ROUTINE W REFLEX MICROSCOPIC
BILIRUBIN URINE: NEGATIVE
GLUCOSE, UA: NEGATIVE mg/dL
HGB URINE DIPSTICK: NEGATIVE
KETONES UR: NEGATIVE mg/dL
Leukocytes, UA: NEGATIVE
Nitrite: NEGATIVE
PH: 5 (ref 5.0–8.0)
Protein, ur: NEGATIVE mg/dL
Urobilinogen, UA: 0.2 mg/dL (ref 0.0–1.0)

## 2013-09-07 LAB — COMPREHENSIVE METABOLIC PANEL
ALT: 11 U/L (ref 0–35)
AST: 20 U/L (ref 0–37)
Albumin: 4.8 g/dL (ref 3.5–5.2)
Alkaline Phosphatase: 57 U/L (ref 39–117)
BUN: 6 mg/dL (ref 6–23)
CALCIUM: 9.5 mg/dL (ref 8.4–10.5)
CO2: 23 mEq/L (ref 19–32)
Chloride: 95 mEq/L — ABNORMAL LOW (ref 96–112)
Creatinine, Ser: 1.09 mg/dL (ref 0.50–1.10)
GFR calc non Af Amer: 71 mL/min — ABNORMAL LOW (ref >90)
GFR, EST AFRICAN AMERICAN: 82 mL/min — AB (ref >90)
GLUCOSE: 75 mg/dL (ref 70–99)
POTASSIUM: 3.6 meq/L — AB (ref 3.7–5.3)
SODIUM: 136 meq/L — AB (ref 137–147)
TOTAL PROTEIN: 8.1 g/dL (ref 6.0–8.3)
Total Bilirubin: 0.7 mg/dL (ref 0.3–1.2)

## 2013-09-07 LAB — ETHANOL

## 2013-09-07 LAB — RAPID URINE DRUG SCREEN, HOSP PERFORMED
AMPHETAMINES: NOT DETECTED
BENZODIAZEPINES: NOT DETECTED
Barbiturates: NOT DETECTED
COCAINE: NOT DETECTED
OPIATES: NOT DETECTED
Tetrahydrocannabinol: POSITIVE — AB

## 2013-09-07 LAB — PREGNANCY, URINE: Preg Test, Ur: NEGATIVE

## 2013-09-07 MED ORDER — ACETAMINOPHEN 325 MG PO TABS
650.0000 mg | ORAL_TABLET | ORAL | Status: DC | PRN
  Administered 2013-09-07: 650 mg via ORAL
  Filled 2013-09-07: qty 2

## 2013-09-07 MED ORDER — ONDANSETRON HCL 4 MG PO TABS
4.0000 mg | ORAL_TABLET | Freq: Three times a day (TID) | ORAL | Status: DC | PRN
  Administered 2013-09-07: 4 mg via ORAL
  Filled 2013-09-07: qty 1

## 2013-09-07 MED ORDER — LORAZEPAM 1 MG PO TABS: 1.0000 mg | ORAL_TABLET | Freq: Once | ORAL | Status: DC

## 2013-09-07 NOTE — ED Provider Notes (Signed)
CSN: [id number]     Arrival date & time 09/07/13  1632 History   First MD Initiated Contact with Patient 09/07/13 1640     Chief Complaint  Patient presents with  . V70.1     (Consider location/radiation/quality/duration/timing/severity/associated sxs/prior Treatment) HPI Comments: Patient presents with a several month history of feeling sad and depressed. She denies any history of depression or take any medications. She denies a plan to hurt herself denies any suicidal ideation. She told her parole officer she was depressed and he brought her here. She was having suicidal thoughts yesterday evening and wrote her son a suicide letter. She denies any suicidal thoughts at this time. Denies homicidal thoughts or hallucinations. Denies any excessive alcohol use. She uses marijuana occasionally. Good by mouth intake and urine output.  The history is provided by the patient.    Past Medical History  Diagnosis Date  . Herpes   . Hx of chlamydia infection   . Depression    Past Surgical History  Procedure Laterality Date  . Wisdom tooth extraction     Family History  Problem Relation Age of Onset  . Hypertension Other   . Diabetes Other   . Stroke Other    History  Substance Use Topics  . Smoking status: Current Every Day Smoker    Types: Cigarettes  . Smokeless tobacco: Never Used  . Alcohol Use: No   OB History   Grav Para Term Preterm Abortions TAB SAB Ect Mult Living   1 1 1       1      Review of Systems  Constitutional: Positive for activity change and appetite change. Negative for fever and fatigue.  HENT: Negative for congestion and rhinorrhea.   Respiratory: Negative for cough and chest tightness.   Cardiovascular: Negative for chest pain.  Gastrointestinal: Negative for nausea, vomiting and abdominal pain.  Genitourinary: Negative for dysuria, hematuria, vaginal bleeding and vaginal discharge.  Musculoskeletal: Negative for arthralgias and myalgias.  Skin: Negative  for rash.  Neurological: Negative for dizziness, weakness and headaches.  Psychiatric/Behavioral: Positive for suicidal ideas and decreased concentration. The patient is nervous/anxious.   A complete 10 system review of systems was obtained and all systems are negative except as noted in the HPI and PMH.      Allergies  Review of patient's allergies indicates no known allergies.  Home Medications   Prior to Admission medications   Medication Sig Start Date End Date Taking? Authorizing Provider  hydrocortisone cream 1 % Apply to affected area 2 times daily 05/20/12   Benny LennertJoseph L Zammit, MD  levonorgestrel (MIRENA) 20 MCG/24HR IUD 1 each by Intrauterine route once.    Historical Provider, MD  promethazine (PHENERGAN) 25 MG tablet Take 1 tablet (25 mg total) by mouth every 6 (six) hours as needed for nausea. 05/20/12   Benny LennertJoseph L Zammit, MD   BP 118/67  Pulse 61  Temp(Src) 98.6 F (37 C) (Oral)  Resp 20  Ht 5\' 2"  (1.575 m)  Wt 147 lb 3 oz (66.764 kg)  BMI 26.91 kg/m2  SpO2 100% Physical Exam  Constitutional: She is oriented to person, place, and time. She appears well-developed and well-nourished. No distress.  Flat affect  HENT:  Head: Normocephalic and atraumatic.  Mouth/Throat: Oropharynx is clear and moist. No oropharyngeal exudate.  Eyes: Conjunctivae and EOM are normal. Pupils are equal, round, and reactive to light.  Neck: Normal range of motion. Neck supple.  Cardiovascular: Normal rate, regular rhythm and normal heart  sounds.   No murmur heard. Pulmonary/Chest: Effort normal and breath sounds normal. No respiratory distress.  Abdominal: Soft. Bowel sounds are normal. There is no tenderness. There is no rebound and no guarding.  Musculoskeletal: Normal range of motion. She exhibits no edema and no tenderness.  Neurological: She is alert and oriented to person, place, and time. No cranial nerve deficit. She exhibits normal muscle tone. Coordination normal.  Skin: Skin is warm.     ED Course  Procedures (including critical care time) Labs Review Labs Reviewed  CBC WITH DIFFERENTIAL - Abnormal; Notable for the following:    WBC 12.9 (*)    Neutro Abs 9.7 (*)    All other components within normal limits  COMPREHENSIVE METABOLIC PANEL - Abnormal; Notable for the following:    Sodium 136 (*)    Potassium 3.6 (*)    Chloride 95 (*)    GFR calc non Af Amer 71 (*)    GFR calc Af Amer 82 (*)    All other components within normal limits  URINE RAPID DRUG SCREEN (HOSP PERFORMED) - Abnormal; Notable for the following:    Tetrahydrocannabinol POSITIVE (*)    All other components within normal limits  URINALYSIS, ROUTINE W REFLEX MICROSCOPIC - Abnormal; Notable for the following:    Specific Gravity, Urine <1.005 (*)    All other components within normal limits  ETHANOL  PREGNANCY, URINE    Imaging Review No results found.   EKG Interpretation None      MDM   Final diagnoses:  Major depression   Depression without active suicidal plan. No distress. Vital stable.  Screening labs. Discussed with TTS.  Screening labs are unremarkable.  Patient has been seen and evaluated by Renata Capriceonrad NP. She initially appeared to be stable and was able to contract for safety. However with observation admission was suggested, patient became agitated and upset and unable to contract for safety. Per Renata Capriceonrad patient will need telepsych again tomorrow morning for possible discharge.    Glynn OctaveStephen Thad Osoria, MD 09/07/13 2134

## 2013-09-07 NOTE — Consult Note (Signed)
Telepsych Consultation   Reason for Consult:  MDD, wrote suicide note to son Referring Physician:  EDP Alexandria Bradshaw is an 23 y.o. female.  Assessment: AXIS I:  Major Depression, Recurrent severe AXIS II:  Deferred AXIS III:   Past Medical History  Diagnosis Date  . Herpes   . Hx of chlamydia infection   . Depression    AXIS IV:  other psychosocial or environmental problems and problems related to social environment AXIS V:  41-50 serious symptoms  Plan:  Supportive therapy provided about ongoing stressors.  Subjective:   Alexandria Bradshaw is a 23 y.o. female patient presenting to [facility] s/p expression depressive thoughts to parole officer and revealing to officer that she wrote a suicide note to her son. Pt denies SI, HI, and AVH, contracts for safety. However, pt arrived only 2 hours ago in the ED and this is inconsistent with the statements made at the time of admission and the main reason for admission. Pt reports daily THC usage, but denies ETOH. Pt reports that she needs to work tomorrow morning. Informed pt that she can come to the OBS unit for a hold. Pt began to sob and stated that her main stressor is financing and that if she stays in the hospital too long, she will not be able to go to work. Dr. Wyvonnia Dusky suggested that pt remain at [facility] until tomorrow morning for telepsych for possible discharge given pt's stable condition mentioned above and his assessment of her presenting as stable also. Pt will have a telepsych tomorrow morning for likely discharge if she continues to present denying admission criteria symptoms.   HPI:  Patient presents with a several month history of feeling sad and depressed. She denies any history of depression or take any medications. She denies a plan to hurt herself denies any suicidal ideation. She told her parole officer she was depressed and he brought her here. She was having suicidal thoughts yesterday evening and wrote her son a suicide letter.  She denies any suicidal thoughts at this time. Denies homicidal thoughts or hallucinations. Denies any excessive alcohol use. She uses marijuana occasionally. Good by mouth intake and urine output.  HPI Elements:   Location:  Generalized, Inpatient [facility]. Quality:  Stable. Severity:  Severe. Timing:  Constant. Duration:  Chronic.  Past Psychiatric History: Past Medical History  Diagnosis Date  . Herpes   . Hx of chlamydia infection   . Depression     reports that she has been smoking Cigarettes.  She has been smoking about 0.00 packs per day. She has never used smokeless tobacco. She reports that she uses illicit drugs (Marijuana). She reports that she does not drink alcohol. Family History  Problem Relation Age of Onset  . Hypertension Other   . Diabetes Other   . Stroke Other          Allergies:  No Known Allergies  ACT Assessment Complete:  Yes:    Educational Status    Risk to Self: Risk to self Is patient at risk for suicide?: Yes Substance abuse history and/or treatment for substance abuse?: No  Risk to Others:    Abuse:    Prior Inpatient Therapy:    Prior Outpatient Therapy:    Additional Information:                    Objective: Blood pressure 114/67, pulse 91, temperature 98.6 F (37 C), temperature source Oral, resp. rate 20, height $RemoveBe'5\' 2"'NLSKhMgmR$  (1.575  m), weight 66.764 kg (147 lb 3 oz), SpO2 100.00%.Body mass index is 26.91 kg/(m^2). Results for orders placed during the hospital encounter of 09/07/13 (from the past 72 hour(s))  URINE RAPID DRUG SCREEN (HOSP PERFORMED)     Status: Abnormal   Collection Time    09/07/13  4:47 PM      Result Value Ref Range   Opiates NONE DETECTED  NONE DETECTED   Cocaine NONE DETECTED  NONE DETECTED   Benzodiazepines NONE DETECTED  NONE DETECTED   Amphetamines NONE DETECTED  NONE DETECTED   Tetrahydrocannabinol POSITIVE (*) NONE DETECTED   Barbiturates NONE DETECTED  NONE DETECTED   Comment:            DRUG  SCREEN FOR MEDICAL PURPOSES     ONLY.  IF CONFIRMATION IS NEEDED     FOR ANY PURPOSE, NOTIFY LAB     WITHIN 5 DAYS.                LOWEST DETECTABLE LIMITS     FOR URINE DRUG SCREEN     Drug Class       Cutoff (ng/mL)     Amphetamine      1000     Barbiturate      200     Benzodiazepine   979     Tricyclics       892     Opiates          300     Cocaine          300     THC              50  PREGNANCY, URINE     Status: None   Collection Time    09/07/13  4:47 PM      Result Value Ref Range   Preg Test, Ur NEGATIVE  NEGATIVE   Comment:            THE SENSITIVITY OF THIS     METHODOLOGY IS >20 mIU/mL.  URINALYSIS, ROUTINE W REFLEX MICROSCOPIC     Status: Abnormal   Collection Time    09/07/13  4:47 PM      Result Value Ref Range   Color, Urine YELLOW  YELLOW   APPearance CLEAR  CLEAR   Specific Gravity, Urine <1.005 (*) 1.005 - 1.030   pH 5.0  5.0 - 8.0   Glucose, UA NEGATIVE  NEGATIVE mg/dL   Hgb urine dipstick NEGATIVE  NEGATIVE   Bilirubin Urine NEGATIVE  NEGATIVE   Ketones, ur NEGATIVE  NEGATIVE mg/dL   Protein, ur NEGATIVE  NEGATIVE mg/dL   Urobilinogen, UA 0.2  0.0 - 1.0 mg/dL   Nitrite NEGATIVE  NEGATIVE   Leukocytes, UA NEGATIVE  NEGATIVE   Comment: MICROSCOPIC NOT DONE ON URINES WITH NEGATIVE PROTEIN, BLOOD, LEUKOCYTES, NITRITE, OR GLUCOSE <1000 mg/dL.  CBC WITH DIFFERENTIAL     Status: Abnormal   Collection Time    09/07/13  4:56 PM      Result Value Ref Range   WBC 12.9 (*) 4.0 - 10.5 K/uL   RBC 4.05  3.87 - 5.11 MIL/uL   Hemoglobin 13.2  12.0 - 15.0 g/dL   HCT 38.6  36.0 - 46.0 %   MCV 95.3  78.0 - 100.0 fL   MCH 32.6  26.0 - 34.0 pg   MCHC 34.2  30.0 - 36.0 g/dL   RDW 12.4  11.5 - 15.5 %   Platelets 210  150 - 400 K/uL   Neutrophils Relative % 75  43 - 77 %   Neutro Abs 9.7 (*) 1.7 - 7.7 K/uL   Lymphocytes Relative 19  12 - 46 %   Lymphs Abs 2.5  0.7 - 4.0 K/uL   Monocytes Relative 6  3 - 12 %   Monocytes Absolute 0.7  0.1 - 1.0 K/uL   Eosinophils  Relative 0  0 - 5 %   Eosinophils Absolute 0.0  0.0 - 0.7 K/uL   Basophils Relative 0  0 - 1 %   Basophils Absolute 0.0  0.0 - 0.1 K/uL  COMPREHENSIVE METABOLIC PANEL     Status: Abnormal   Collection Time    09/07/13  4:56 PM      Result Value Ref Range   Sodium 136 (*) 137 - 147 mEq/L   Potassium 3.6 (*) 3.7 - 5.3 mEq/L   Chloride 95 (*) 96 - 112 mEq/L   CO2 23  19 - 32 mEq/L   Glucose, Bld 75  70 - 99 mg/dL   BUN 6  6 - 23 mg/dL   Creatinine, Ser 1.09  0.50 - 1.10 mg/dL   Calcium 9.5  8.4 - 10.5 mg/dL   Total Protein 8.1  6.0 - 8.3 g/dL   Albumin 4.8  3.5 - 5.2 g/dL   AST 20  0 - 37 U/L   ALT 11  0 - 35 U/L   Alkaline Phosphatase 57  39 - 117 U/L   Total Bilirubin 0.7  0.3 - 1.2 mg/dL   GFR calc non Af Amer 71 (*) >90 mL/min   GFR calc Af Amer 82 (*) >90 mL/min   Comment: (NOTE)     The eGFR has been calculated using the CKD EPI equation.     This calculation has not been validated in all clinical situations.     eGFR's persistently <90 mL/min signify possible Chronic Kidney     Disease.  ETHANOL     Status: None   Collection Time    09/07/13  4:56 PM      Result Value Ref Range   Alcohol, Ethyl (B) <11  0 - 11 mg/dL   Comment:            LOWEST DETECTABLE LIMIT FOR     SERUM ALCOHOL IS 11 mg/dL     FOR MEDICAL PURPOSES ONLY   Labs are reviewed and are pertinent for + THC.   Current Facility-Administered Medications  Medication Dose Route Frequency Provider Last Rate Last Dose  . acetaminophen (TYLENOL) tablet 650 mg  650 mg Oral Q4H PRN Ezequiel Essex, MD   650 mg at 09/07/13 1936  . LORazepam (ATIVAN) tablet 1 mg  1 mg Oral Once Ezequiel Essex, MD      . ondansetron [facility]) tablet 4 mg  4 mg Oral Q8H PRN Ezequiel Essex, MD       Current Outpatient Prescriptions  Medication Sig Dispense Refill  . levonorgestrel (MIRENA) 20 MCG/24HR IUD 1 each by Intrauterine route once.        Psychiatric Specialty Exam:     Blood pressure 114/67, pulse 91, temperature  98.6 F (37 C), temperature source Oral, resp. rate 20, height $RemoveBe'5\' 2"'RGBniRrwa$  (1.575 m), weight 66.764 kg (147 lb 3 oz), SpO2 100.00%.Body mass index is 26.91 kg/(m^2).  General Appearance: Casual  Eye Contact::  Good  Speech:  Clear and Coherent  Volume:  Normal  Mood:  Anxious and Depressed  Affect:  Congruent and Tearful  Thought Process:  Goal Directed  Orientation:  Full (Time, Place, and Person)  Thought Content:  WDL  Suicidal Thoughts:  No  Homicidal Thoughts:  No  Memory:  Immediate;   Good Recent;   Good Remote;   Good  Judgement:  Fair  Insight:  Fair  Psychomotor Activity:  Normal  Concentration:  Good  Recall:  Fair  Akathisia:  No  Handed:  Right  AIMS (if indicated):     Assets:  Communication Skills Desire for Improvement Resilience  Sleep:      Treatment Plan Summary: -Hold in [facility] for telepsych in AM by [employer] NP for possible discharge home if stable. If not stable, admission to [facility] will be considered. EDP Dr. Wyvonnia Dusky in agreement with this plan as well at [facility].  Disposition:See above.    Benjamine Mola, FNP-BC 09/07/2013 7:43 PM

## 2013-09-07 NOTE — ED Notes (Signed)
In to patients room. Vital signs taken and within normal limits. Pt seems to be resting okay.

## 2013-09-07 NOTE — Progress Notes (Signed)
Patient will receive a Tele Psych Assessment with the NP Conrad at 5:45pm

## 2013-09-07 NOTE — ED Notes (Signed)
Im room to take patients vitals. Vitals within normal limits. Pt still seems to be very sad. Questioned patient if she needed anything stated no at this time.

## 2013-09-07 NOTE — ED Notes (Addendum)
Pt upset since telepysch done, pt upset crying and states she wants to see her son and that she is not going to hurt herself, refused Ativan at this time

## 2013-09-07 NOTE — ED Notes (Addendum)
Pt has covers on and stated she was nauseated. Patients vitals taken and within normal limits. Notified nurse that patient was feeling nauseated and stated she needed something for the nausea.

## 2013-09-07 NOTE — ED Notes (Signed)
Pt tearful, says has been depressed for  "a while."  States, "I feel like I'm in a hole that I can't get out of."  Reports was having SI yesterday evening and wrote her son a letter.   Denies HI.  [facility]

## 2013-09-08 ENCOUNTER — Encounter (HOSPITAL_COMMUNITY): Payer: Self-pay | Admitting: Family

## 2013-09-08 NOTE — ED Notes (Signed)
Telepysch in process. Sitter at [facility].

## 2013-09-08 NOTE — ED Provider Notes (Signed)
Patient is hemodynamically stable, in no distress.  Behavioral health consult if the patient should be discharged with followup tomorrow, which is already scheduled.  Alexandria Munchobert Avry Monteleone, MD 09/08/13 1329

## 2013-09-08 NOTE — Discharge Instructions (Signed)
As discussed, it is important that you [facility] of your [facility].  If you develop any new, or concerning changes in your condition, please return to the emergency department immediately.   Depression, Adult Depression is feeling sad, low, down in the dumps, blue, gloomy, or empty. In general, there are two kinds of depression:  Normal sadness or grief. This can happen after something upsetting. It often goes away on its own within 2 weeks. After losing a loved one (bereavement), normal sadness and grief may last longer than two weeks. It usually gets better with time.  Clinical depression. This kind lasts longer than normal sadness or grief. It keeps you from doing the things you normally do in life. It is often hard to function at home, work, or at school. It may affect your relationships with others. Treatment is often needed. GET HELP RIGHT AWAY IF:  You have thoughts about hurting yourself or others.  You lose touch with reality (psychotic symptoms). You may:  See or hear things that are not real.  Have untrue beliefs about your life or people around you.  Your medicine is giving you problems. MAKE SURE YOU:  Understand these instructions.  Will watch your condition.  Will get help right away if you are not doing well or get worse. Document Released: 06/08/2010 Document Revised: 01/29/2012 Document Reviewed: 09/05/2011 [facility] Patient Information 2014 [employer], [place].

## 2013-09-08 NOTE — ED Notes (Signed)
Pt waiting for 2nd telepsych evaluation to determine discharge.  [facility] is aware.  They to call with appointment.

## 2013-09-08 NOTE — ED Notes (Signed)
nad noted prior to dc. Dc instructions reviewed and explained. Voiced understanding for f/u

## 2013-09-08 NOTE — Consult Note (Signed)
Telepsych Consultation   Reason for Consult:  MDD, reassessment for possible d/c Referring Physician:  EDP Alexandria Bradshaw is an 23 y.o. female.  Assessment: AXIS I:  Major Depression, Recurrent severe AXIS II:  Deferred AXIS III:   Past Medical History  Diagnosis Date  . Herpes   . Hx of chlamydia infection   . Depression    AXIS IV:  other psychosocial or environmental problems and problems related to social environment AXIS V:  61-70 mild symptoms  Plan:  Supportive therapy provided about ongoing stressors.  Subjective:   Alexandria Bradshaw is a 23 y.o. female patient presenting to [place] s/p expression depressive thoughts to parole officer and revealing to officer that she wrote a suicide note to her son. Pt denies SI, HI, and AVH, contracts for safety. Pt has a plan in place if she becomes overwhelmed which includes calling 911 and also calling her best friend Demetrius. Pt states that she wants to live to take care of her son and has no intention whatsoever of harming herself. Pt also has a stable job and good family support system, as well as being followed by [facility] with an appointment tomorrow.   HPI:  Patient presents with a several month history of feeling sad and depressed. She denies any history of depression or take any medications. She denies a plan to hurt herself denies any suicidal ideation. She told her parole officer she was depressed and he brought her here. She was having suicidal thoughts yesterday evening and wrote her son a suicide letter. She denies any suicidal thoughts at this time. Denies homicidal thoughts or hallucinations. Denies any excessive alcohol use. She uses marijuana occasionally. Good by mouth intake and urine output.  HPI Elements:   Location:  Generalized, Inpatient [facility]. Quality:  Stable. Severity:  Severe. Timing:  Constant. Duration:  Chronic.  Past Psychiatric History: Past Medical History  Diagnosis Date  . Herpes   . Hx of  chlamydia infection   . Depression     reports that she has been smoking Cigarettes.  She has been smoking about 0.00 packs per day. She has never used smokeless tobacco. She reports that she uses illicit drugs (Marijuana). She reports that she does not drink alcohol. Family History  Problem Relation Age of Onset  . Hypertension Other   . Diabetes Other   . Stroke Other          Allergies:  No Known Allergies  ACT Assessment Complete:  Yes:    Educational Status    Risk to Self: Risk to self Is patient at risk for suicide?: Yes Substance abuse history and/or treatment for substance abuse?: No  Risk to Others:    Abuse:    Prior Inpatient Therapy:    Prior Outpatient Therapy:    Additional Information:     Objective: Blood pressure 116/67, pulse 85, temperature 98.1 F (36.7 C), temperature source Oral, resp. rate 16, height 5' 2" (1.575 m), weight 66.764 kg (147 lb 3 oz), SpO2 100.00%.Body mass index is 26.91 kg/(m^2). Results for orders placed during the hospital encounter of 09/07/13 (from the past 72 hour(s))  URINE RAPID DRUG SCREEN (HOSP PERFORMED)     Status: Abnormal   Collection Time    09/07/13  4:47 PM      Result Value Ref Range   Opiates NONE DETECTED  NONE DETECTED   Cocaine NONE DETECTED  NONE DETECTED   Benzodiazepines NONE DETECTED  NONE DETECTED   Amphetamines NONE DETECTED  NONE DETECTED   Tetrahydrocannabinol POSITIVE (*) NONE DETECTED   Barbiturates NONE DETECTED  NONE DETECTED   Comment:            DRUG SCREEN FOR MEDICAL PURPOSES     ONLY.  IF CONFIRMATION IS NEEDED     FOR ANY PURPOSE, NOTIFY LAB     WITHIN 5 DAYS.                LOWEST DETECTABLE LIMITS     FOR URINE DRUG SCREEN     Drug Class       Cutoff (ng/mL)     Amphetamine      1000     Barbiturate      200     Benzodiazepine   388     Tricyclics       828     Opiates          300     Cocaine          300     THC              50  PREGNANCY, URINE     Status: None   Collection  Time    09/07/13  4:47 PM      Result Value Ref Range   Preg Test, Ur NEGATIVE  NEGATIVE   Comment:            THE SENSITIVITY OF THIS     METHODOLOGY IS >20 mIU/mL.  URINALYSIS, ROUTINE W REFLEX MICROSCOPIC     Status: Abnormal   Collection Time    09/07/13  4:47 PM      Result Value Ref Range   Color, Urine YELLOW  YELLOW   APPearance CLEAR  CLEAR   Specific Gravity, Urine <1.005 (*) 1.005 - 1.030   pH 5.0  5.0 - 8.0   Glucose, UA NEGATIVE  NEGATIVE mg/dL   Hgb urine dipstick NEGATIVE  NEGATIVE   Bilirubin Urine NEGATIVE  NEGATIVE   Ketones, ur NEGATIVE  NEGATIVE mg/dL   Protein, ur NEGATIVE  NEGATIVE mg/dL   Urobilinogen, UA 0.2  0.0 - 1.0 mg/dL   Nitrite NEGATIVE  NEGATIVE   Leukocytes, UA NEGATIVE  NEGATIVE   Comment: MICROSCOPIC NOT DONE ON URINES WITH NEGATIVE PROTEIN, BLOOD, LEUKOCYTES, NITRITE, OR GLUCOSE <1000 mg/dL.  CBC WITH DIFFERENTIAL     Status: Abnormal   Collection Time    09/07/13  4:56 PM      Result Value Ref Range   WBC 12.9 (*) 4.0 - 10.5 K/uL   RBC 4.05  3.87 - 5.11 MIL/uL   Hemoglobin 13.2  12.0 - 15.0 g/dL   HCT 38.6  36.0 - 46.0 %   MCV 95.3  78.0 - 100.0 fL   MCH 32.6  26.0 - 34.0 pg   MCHC 34.2  30.0 - 36.0 g/dL   RDW 12.4  11.5 - 15.5 %   Platelets 210  150 - 400 K/uL   Neutrophils Relative % 75  43 - 77 %   Neutro Abs 9.7 (*) 1.7 - 7.7 K/uL   Lymphocytes Relative 19  12 - 46 %   Lymphs Abs 2.5  0.7 - 4.0 K/uL   Monocytes Relative 6  3 - 12 %   Monocytes Absolute 0.7  0.1 - 1.0 K/uL   Eosinophils Relative 0  0 - 5 %   Eosinophils Absolute 0.0  0.0 - 0.7 K/uL   Basophils Relative 0  0 -  1 %   Basophils Absolute 0.0  0.0 - 0.1 K/uL  COMPREHENSIVE METABOLIC PANEL     Status: Abnormal   Collection Time    09/07/13  4:56 PM      Result Value Ref Range   Sodium 136 (*) 137 - 147 mEq/L   Potassium 3.6 (*) 3.7 - 5.3 mEq/L   Chloride 95 (*) 96 - 112 mEq/L   CO2 23  19 - 32 mEq/L   Glucose, Bld 75  70 - 99 mg/dL   BUN 6  6 - 23 mg/dL    Creatinine, Ser 1.09  0.50 - 1.10 mg/dL   Calcium 9.5  8.4 - 10.5 mg/dL   Total Protein 8.1  6.0 - 8.3 g/dL   Albumin 4.8  3.5 - 5.2 g/dL   AST 20  0 - 37 U/L   ALT 11  0 - 35 U/L   Alkaline Phosphatase 57  39 - 117 U/L   Total Bilirubin 0.7  0.3 - 1.2 mg/dL   GFR calc non Af Amer 71 (*) >90 mL/min   GFR calc Af Amer 82 (*) >90 mL/min   Comment: (NOTE)     The eGFR has been calculated using the CKD EPI equation.     This calculation has not been validated in all clinical situations.     eGFR's persistently <90 mL/min signify possible Chronic Kidney     Disease.  ETHANOL     Status: None   Collection Time    09/07/13  4:56 PM      Result Value Ref Range   Alcohol, Ethyl (B) <11  0 - 11 mg/dL   Comment:            LOWEST DETECTABLE LIMIT FOR     SERUM ALCOHOL IS 11 mg/dL     FOR MEDICAL PURPOSES ONLY   Labs are reviewed and are pertinent for + THC.   Current Facility-Administered Medications  Medication Dose Route Frequency Provider Last Rate Last Dose  . acetaminophen (TYLENOL) tablet 650 mg  650 mg Oral Q4H PRN Ezequiel Essex, MD   650 mg at 09/07/13 1936  . LORazepam (ATIVAN) tablet 1 mg  1 mg Oral Once Ezequiel Essex, MD      . ondansetron [facility]) tablet 4 mg  4 mg Oral Q8H PRN Ezequiel Essex, MD   4 mg at 09/07/13 2033   Current Outpatient Prescriptions  Medication Sig Dispense Refill  . levonorgestrel (MIRENA) 20 MCG/24HR IUD 1 each by Intrauterine route once.        Psychiatric Specialty Exam:     Blood pressure 116/67, pulse 85, temperature 98.1 F (36.7 C), temperature source Oral, resp. rate 16, height 5' 2" (1.575 m), weight 66.764 kg (147 lb 3 oz), SpO2 100.00%.Body mass index is 26.91 kg/(m^2).  General Appearance: Casual  Eye Contact::  Good  Speech:  Clear and Coherent  Volume:  Normal  Mood:  Euthymic  Affect:  Appropriate  Thought Process:  Goal Directed  Orientation:  Full (Time, Place, and Person)  Thought Content:  WDL  Suicidal Thoughts:  No   Homicidal Thoughts:  No  Memory:  Immediate;   Good Recent;   Good Remote;   Good  Judgement:  Fair  Insight:  Fair  Psychomotor Activity:  Normal  Concentration:  Good  Recall:  Fair  Akathisia:  No  Handed:  Right  AIMS (if indicated):     Assets:  Communication Skills Desire for Improvement Resilience  Sleep:  Treatment Plan Summary: -Discharge pt home with referral to therapist at [facility]. Pt already has an appointment with [facility] tomorrow morning and will be following up with them for substance abuse counseling and therapy for depression.  Disposition:See above.  *Case reviewed with Dr. Dwyane Dee.    Benjamine Mola, FNP-BC 09/08/2013 11:44 AM

## 2013-09-08 NOTE — ED Notes (Signed)
Belongings given to pt by C. Shelton SilvasStraughn

## 2013-09-08 NOTE — Progress Notes (Signed)
Patient will receive a Tele Psych with Renata Capriceonrad, at 10:45am.

## 2013-09-09 NOTE — Consult Note (Signed)
Case discussed, agree with plan 

## 2013-09-09 NOTE — Consult Note (Signed)
Case discussed and, agree with plan.

## 2014-01-26 ENCOUNTER — Emergency Department (HOSPITAL_COMMUNITY)
Admission: EM | Admit: 2014-01-26 | Discharge: 2014-01-26 | Discharge status: Against Medical Advice | Payer: Self-pay | Attending: Emergency Medicine | Admitting: Emergency Medicine

## 2014-01-26 ENCOUNTER — Encounter (HOSPITAL_COMMUNITY): Payer: Self-pay | Admitting: Emergency Medicine

## 2014-01-26 DIAGNOSIS — F17200 Nicotine dependence, unspecified, uncomplicated: Secondary | ICD-10-CM | POA: Insufficient documentation

## 2014-01-26 DIAGNOSIS — R109 Unspecified abdominal pain: Secondary | ICD-10-CM | POA: Insufficient documentation

## 2014-01-26 LAB — LIPASE, BLOOD: Lipase: 14 U/L (ref 11–59)

## 2014-01-26 LAB — COMPREHENSIVE METABOLIC PANEL
ALK PHOS: 61 U/L (ref 39–117)
ALT: 10 U/L (ref 0–35)
AST: 15 U/L (ref 0–37)
Albumin: 3.7 g/dL (ref 3.5–5.2)
Anion gap: 10 (ref 5–15)
BILIRUBIN TOTAL: 0.3 mg/dL (ref 0.3–1.2)
BUN: 4 mg/dL — AB (ref 6–23)
CO2: 29 mEq/L (ref 19–32)
Calcium: 9.7 mg/dL (ref 8.4–10.5)
Chloride: 102 mEq/L (ref 96–112)
Creatinine, Ser: 0.76 mg/dL (ref 0.50–1.10)
GFR calc non Af Amer: >90 mL/min (ref >90)
Glucose, Bld: 74 mg/dL (ref 70–99)
POTASSIUM: 4.4 meq/L (ref 3.7–5.3)
Sodium: 141 mEq/L (ref 137–147)
TOTAL PROTEIN: 7.8 g/dL (ref 6.0–8.3)

## 2014-01-26 LAB — CBC WITH DIFFERENTIAL/PLATELET
BASOS PCT: 0 % (ref 0–1)
Basophils Absolute: 0 10*3/uL (ref 0.0–0.1)
EOS ABS: 0.1 10*3/uL (ref 0.0–0.7)
Eosinophils Relative: 1 % (ref 0–5)
HEMATOCRIT: 36.4 % (ref 36.0–46.0)
HEMOGLOBIN: 12.4 g/dL (ref 12.0–15.0)
Lymphocytes Relative: 33 % (ref 12–46)
Lymphs Abs: 3.4 10*3/uL (ref 0.7–4.0)
MCH: 32.1 pg (ref 26.0–34.0)
MCHC: 34.1 g/dL (ref 30.0–36.0)
MCV: 94.3 fL (ref 78.0–100.0)
MONO ABS: 0.7 10*3/uL (ref 0.1–1.0)
MONOS PCT: 7 % (ref 3–12)
Neutro Abs: 5.9 10*3/uL (ref 1.7–7.7)
Neutrophils Relative %: 59 % (ref 43–77)
Platelets: 363 10*3/uL (ref 150–400)
RBC: 3.86 MIL/uL — AB (ref 3.87–5.11)
RDW: 13 % (ref 11.5–15.5)
WBC: 10.1 10*3/uL (ref 4.0–10.5)

## 2014-01-26 NOTE — ED Notes (Signed)
Pt reports right sided abdominal pain x1 week. Pt reports urinary frequency. Pt denies any n/v/d.

## 2014-03-21 ENCOUNTER — Encounter (HOSPITAL_COMMUNITY): Payer: Self-pay | Admitting: Emergency Medicine

## 2015-03-21 ENCOUNTER — Emergency Department (HOSPITAL_COMMUNITY): Payer: Self-pay

## 2015-03-21 ENCOUNTER — Emergency Department (HOSPITAL_COMMUNITY)
Admission: EM | Admit: 2015-03-21 | Discharge: 2015-03-21 | Disposition: A | Discharge status: Home / Self Care | Payer: Self-pay | Attending: Emergency Medicine | Admitting: Emergency Medicine

## 2015-03-21 ENCOUNTER — Encounter (HOSPITAL_COMMUNITY): Payer: Self-pay | Admitting: Emergency Medicine

## 2015-03-21 DIAGNOSIS — Y9289 Other specified places as the place of occurrence of the external cause: Secondary | ICD-10-CM | POA: Insufficient documentation

## 2015-03-21 DIAGNOSIS — Y9389 Activity, other specified: Secondary | ICD-10-CM | POA: Insufficient documentation

## 2015-03-21 DIAGNOSIS — Z8619 Personal history of other infectious and parasitic diseases: Secondary | ICD-10-CM | POA: Insufficient documentation

## 2015-03-21 DIAGNOSIS — Z8659 Personal history of other mental and behavioral disorders: Secondary | ICD-10-CM | POA: Insufficient documentation

## 2015-03-21 DIAGNOSIS — Y998 Other external cause status: Secondary | ICD-10-CM | POA: Insufficient documentation

## 2015-03-21 DIAGNOSIS — Z720 Tobacco use: Secondary | ICD-10-CM | POA: Insufficient documentation

## 2015-03-21 DIAGNOSIS — S93401A Sprain of unspecified ligament of right ankle, initial encounter: Secondary | ICD-10-CM | POA: Insufficient documentation

## 2015-03-21 DIAGNOSIS — W1849XA Other slipping, tripping and stumbling without falling, initial encounter: Secondary | ICD-10-CM | POA: Insufficient documentation

## 2015-03-21 MED ORDER — NAPROXEN 500 MG PO TABS: 500.0000 mg | ORAL_TABLET | Freq: Two times a day (BID) | ORAL | Status: DC

## 2015-03-21 NOTE — ED Provider Notes (Signed)
CSN: [id number]     Arrival date & time 03/21/15  0901 History   First MD Initiated Contact with Patient 03/21/15 [phone]     Chief Complaint  Patient presents with  . Ankle Injury     (Consider location/radiation/quality/duration/timing/severity/associated sxs/prior Treatment) Patient is a 24 y.o. female presenting with lower extremity injury. The history is provided by the patient.  Ankle Injury This is a new problem. The current episode started today. The problem occurs constantly. The problem has been gradually worsening. The symptoms are aggravated by standing and walking. She has tried nothing for the symptoms.   Alexandria Bradshaw is a 24 y.o. female who presents to the ED with right ankle pain. She states that she tripped over a chain while feeding her dog and twisted her ankle. She denies any other injuries.  Past Medical History  Diagnosis Date  . Herpes   . Hx of chlamydia infection   . Depression    Past Surgical History  Procedure Laterality Date  . Wisdom tooth extraction     Family History  Problem Relation Age of Onset  . Hypertension Other   . Diabetes Other   . Stroke Other    Social History  Substance Use Topics  . Smoking status: Current Every Day Smoker -- 0.50 packs/day    Types: Cigarettes  . Smokeless tobacco: Never Used  . Alcohol Use: No   OB History    Gravida Para Term Preterm AB TAB SAB Ectopic Multiple Living   1 1 1       1      Review of Systems Negative except as stated in HPI   Allergies  Review of patient's allergies indicates no known allergies.  Home Medications   Prior to Admission medications   Medication Sig Start Date End Date Taking? Authorizing Provider  naproxen (NAPROSYN) 500 MG tablet Take 1 tablet (500 mg total) by mouth 2 (two) times daily. 03/21/15   Zaliah Wissner Orlene OchM Konnor Jorden, NP   BP 127/72 mmHg  Pulse 102  Temp(Src) 98.2 F (36.8 C) (Oral)  Resp 18  Ht 5\' 2"  (1.575 m)  Wt 145 lb (65.772 kg)  BMI 26.51 kg/m2  SpO2 97%   LMP 02/18/2015 Physical Exam  Constitutional: She is oriented to person, place, and time. She appears well-developed and well-nourished.  HENT:  Head: Normocephalic and atraumatic.  Eyes: EOM are normal.  Neck: Normal range of motion. Neck supple.  Cardiovascular: Normal rate.   Pulmonary/Chest: Effort normal.  Musculoskeletal:       Right ankle: She exhibits swelling. She exhibits no deformity, no laceration and normal pulse. Decreased range of motion: due to pain. Tenderness. Lateral malleolus tenderness found. Achilles tendon exhibits no defect. Pain: tender.       Feet:  Right lateral ankle with swelling and tenderness. Pedal pulses 2+, adequate circulation. Good strength, plantar and dorsiflexion without difficulty.   Neurological: She is alert and oriented to person, place, and time. No cranial nerve deficit.  Skin: Skin is warm and dry.  Psychiatric: She has a normal mood and affect. Her behavior is normal.  Nursing note and vitals reviewed.   ED Course  Procedures (including critical care time) Labs Review Labs Reviewed - No data to display  Imaging Review Dg Ankle Complete Right  03/21/2015  CLINICAL DATA:  LATERAL SIDE RIGHT ANKLE PAIN AND SWELLING S/P GOING OUT TO FEED HER DOG THIS AM, BEING TRIPPED BY THE DOG AND ROLLING HER RIGHT ANKLE EXAM: RIGHT ANKLE - COMPLETE  3+ VIEW COMPARISON:  None. FINDINGS: There is no evidence of fracture, dislocation, or joint effusion. There is no evidence of arthropathy or other focal bone abnormality. Ankle mortise intact. Lateral soft tissue swelling. IMPRESSION: 1. Lateral soft tissue swelling without fracture suggesting ligamentous injury. Electronically Signed   By: Corlis Leak M.D.   On: 03/21/2015 09:26    MDM  24 y.o. female with tenderness and swelling of the lateral right ankle s/p injury. Stable for d/c without focal neuro deficits. Placed in ASO, Crutches, NSAIDS, ice, elevation and follow up with ortho. Discussed with the patient  and all questioned fully answered. She voices understanding and agrees with plan.   Final diagnoses:  Ankle sprain, right, initial encounter       [facility], NP 03/21/15 0981  Pricilla Loveless, MD 03/24/15 0025

## 2015-03-21 NOTE — ED Notes (Signed)
Pt reports tripped over a chain while feeding her dog. nad noted. Moderate swelling noted to right ankle. Distal pulses intact. Limited ROM.

## 2015-03-21 NOTE — Discharge Instructions (Signed)
The orthopedic doctor we have on call today is T. Murphy in [place]. You can try calling Dr. Romeo AppleHarrison or Dr. Hilda LiasKeeling here is [place] if you had rather see someone in town.   Ankle Sprain An ankle sprain is an injury to the strong, fibrous tissues (ligaments) that hold the bones of your ankle joint together.  CAUSES An ankle sprain is usually caused by a fall or by twisting your ankle. Ankle sprains most commonly occur when you step on the outer edge of your foot, and your ankle turns inward. People who participate in sports are more prone to these types of injuries.  SYMPTOMS   Pain in your ankle. The pain may be present at rest or only when you are trying to stand or walk.  Swelling.  Bruising. Bruising may develop immediately or within 1 to 2 days after your injury.  Difficulty standing or walking, particularly when turning corners or changing directions. DIAGNOSIS  Your caregiver will ask you details about your injury and perform a physical exam of your ankle to determine if you have an ankle sprain. During the physical exam, your caregiver will press on and apply pressure to specific areas of your foot and ankle. Your caregiver will try to move your ankle in certain ways. An X-ray exam may be done to be sure a bone was not broken or a ligament did not separate from one of the bones in your ankle (avulsion fracture).  TREATMENT  Certain types of braces can help stabilize your ankle. Your caregiver can make a recommendation for this. Your caregiver may recommend the use of medicine for pain. If your sprain is severe, your caregiver may refer you to a surgeon who helps to restore function to parts of your skeletal system (orthopedist) or a physical therapist. HOME CARE INSTRUCTIONS   Apply ice to your injury for 1-2 days or as directed by your caregiver. Applying ice helps to reduce inflammation and pain.  Put ice in a plastic bag.  Place a towel between your skin and the  bag.  Leave the ice on for 15-20 minutes at a time, every 2 hours while you are awake.  Only take over-the-counter or prescription medicines for pain, discomfort, or fever as directed by your caregiver.  Elevate your injured ankle above the level of your heart as much as possible for 2-3 days.  If your caregiver recommends crutches, use them as instructed. Gradually put weight on the affected ankle. Continue to use crutches or a cane until you can walk without feeling pain in your ankle.  If you have a plaster splint, wear the splint as directed by your caregiver. Do not rest it on anything harder than a pillow for the first 24 hours. Do not put weight on it. Do not get it wet. You may take it off to take a shower or bath.  You may have been given an elastic bandage to wear around your ankle to provide support. If the elastic bandage is too tight (you have numbness or tingling in your foot or your foot becomes cold and blue), adjust the bandage to make it comfortable.  If you have an air splint, you may blow more air into it or let air out to make it more comfortable. You may take your splint off at night and before taking a shower or bath. Wiggle your toes in the splint several times per day to decrease swelling. SEEK MEDICAL CARE IF:   You have rapidly increasing bruising  or swelling.  Your toes feel extremely cold or you lose feeling in your foot.  Your pain is not relieved with medicine. SEEK IMMEDIATE MEDICAL CARE IF:  Your toes are numb or blue.  You have severe pain that is increasing. MAKE SURE YOU:   Understand these instructions.  Will watch your condition.  Will get help right away if you are not doing well or get worse.   This information is not intended to replace advice given to you by your health care provider. Make sure you discuss any questions you have with your health care provider.   Document Released: 05/06/2005 Document Revised: 05/27/2014 Document Reviewed:  05/18/2011 Elsevier Interactive Patient Education [employer]

## 2016-09-09 ENCOUNTER — Encounter (HOSPITAL_COMMUNITY): Payer: Self-pay | Admitting: Emergency Medicine

## 2016-09-09 ENCOUNTER — Emergency Department (HOSPITAL_COMMUNITY): Payer: Medicaid Other

## 2016-09-09 ENCOUNTER — Emergency Department (HOSPITAL_COMMUNITY)
Admission: EM | Admit: 2016-09-09 | Discharge: 2016-09-09 | Disposition: A | Discharge status: Home / Self Care | Payer: Medicaid Other | Attending: Emergency Medicine | Admitting: Emergency Medicine

## 2016-09-09 DIAGNOSIS — Y999 Unspecified external cause status: Secondary | ICD-10-CM | POA: Insufficient documentation

## 2016-09-09 DIAGNOSIS — S42492A Other displaced fracture of lower end of left humerus, initial encounter for closed fracture: Secondary | ICD-10-CM | POA: Diagnosis not present

## 2016-09-09 DIAGNOSIS — Z23 Encounter for immunization: Secondary | ICD-10-CM | POA: Insufficient documentation

## 2016-09-09 DIAGNOSIS — Y92410 Unspecified street and highway as the place of occurrence of the external cause: Secondary | ICD-10-CM | POA: Insufficient documentation

## 2016-09-09 DIAGNOSIS — R51 Headache: Secondary | ICD-10-CM | POA: Insufficient documentation

## 2016-09-09 DIAGNOSIS — Y939 Activity, unspecified: Secondary | ICD-10-CM | POA: Insufficient documentation

## 2016-09-09 DIAGNOSIS — F17210 Nicotine dependence, cigarettes, uncomplicated: Secondary | ICD-10-CM | POA: Diagnosis not present

## 2016-09-09 DIAGNOSIS — Z79899 Other long term (current) drug therapy: Secondary | ICD-10-CM | POA: Insufficient documentation

## 2016-09-09 DIAGNOSIS — S4992XA Unspecified injury of left shoulder and upper arm, initial encounter: Secondary | ICD-10-CM | POA: Diagnosis present

## 2016-09-09 LAB — CBC
HEMATOCRIT: 39.7 % (ref 36.0–46.0)
HEMOGLOBIN: 13.1 g/dL (ref 12.0–15.0)
MCH: 31.3 pg (ref 26.0–34.0)
MCHC: 33 g/dL (ref 30.0–36.0)
MCV: 95 fL (ref 78.0–100.0)
PLATELETS: 239 10*3/uL (ref 150–400)
RBC: 4.18 MIL/uL (ref 3.87–5.11)
RDW: 14 % (ref 11.5–15.5)
WBC: 15.1 10*3/uL — ABNORMAL HIGH (ref 4.0–10.5)

## 2016-09-09 LAB — I-STAT CHEM 8, ED
BUN: 6 mg/dL (ref 6–20)
Calcium, Ion: 1.08 mmol/L — ABNORMAL LOW (ref 1.15–1.40)
Chloride: 106 mmol/L (ref 101–111)
Creatinine, Ser: 0.7 mg/dL (ref 0.44–1.00)
GLUCOSE: 81 mg/dL (ref 65–99)
HEMATOCRIT: 41 % (ref 36.0–46.0)
HEMOGLOBIN: 13.9 g/dL (ref 12.0–15.0)
POTASSIUM: 3.5 mmol/L (ref 3.5–5.1)
Sodium: 140 mmol/L (ref 135–145)
TCO2: 24 mmol/L (ref 0–100)

## 2016-09-09 LAB — URINALYSIS, ROUTINE W REFLEX MICROSCOPIC
BILIRUBIN URINE: NEGATIVE
Glucose, UA: NEGATIVE mg/dL
HGB URINE DIPSTICK: NEGATIVE
Ketones, ur: NEGATIVE mg/dL
Nitrite: NEGATIVE
Protein, ur: NEGATIVE mg/dL
Specific Gravity, Urine: 1.009 (ref 1.005–1.030)
pH: 7 (ref 5.0–8.0)

## 2016-09-09 LAB — COMPREHENSIVE METABOLIC PANEL
ALT: 17 U/L (ref 14–54)
AST: 23 U/L (ref 15–41)
Albumin: 4.1 g/dL (ref 3.5–5.0)
Alkaline Phosphatase: 51 U/L (ref 38–126)
Anion gap: 12 (ref 5–15)
BUN: 6 mg/dL (ref 6–20)
CHLORIDE: 105 mmol/L (ref 101–111)
CO2: 23 mmol/L (ref 22–32)
CREATININE: 0.81 mg/dL (ref 0.44–1.00)
Calcium: 9.4 mg/dL (ref 8.9–10.3)
GFR calc non Af Amer: >60 mL/min (ref >60)
Glucose, Bld: 83 mg/dL (ref 65–99)
POTASSIUM: 3.6 mmol/L (ref 3.5–5.1)
SODIUM: 140 mmol/L (ref 135–145)
Total Bilirubin: 0.4 mg/dL (ref 0.3–1.2)
Total Protein: 7.1 g/dL (ref 6.5–8.1)

## 2016-09-09 LAB — ETHANOL: Alcohol, Ethyl (B): <5 mg/dL (ref <5)

## 2016-09-09 LAB — I-STAT BETA HCG BLOOD, ED (MC, WL, AP ONLY)

## 2016-09-09 LAB — RAPID URINE DRUG SCREEN, HOSP PERFORMED
Amphetamines: NOT DETECTED
BARBITURATES: NOT DETECTED
Benzodiazepines: NOT DETECTED
Cocaine: NOT DETECTED
Opiates: NOT DETECTED
TETRAHYDROCANNABINOL: POSITIVE — AB

## 2016-09-09 LAB — I-STAT CG4 LACTIC ACID, ED: LACTIC ACID, VENOUS: 1.99 mmol/L — AB (ref 0.5–1.9)

## 2016-09-09 LAB — PROTIME-INR
INR: 1.01
PROTHROMBIN TIME: 13.3 s (ref 11.4–15.2)

## 2016-09-09 MED ORDER — SODIUM CHLORIDE 0.9 % IV BOLUS (SEPSIS)
1000.0000 mL | Freq: Once | INTRAVENOUS | Status: AC
  Administered 2016-09-09: 1000 mL via INTRAVENOUS

## 2016-09-09 MED ORDER — ONDANSETRON HCL 4 MG/2ML IJ SOLN
4.0000 mg | Freq: Once | INTRAMUSCULAR | Status: AC
  Administered 2016-09-09: 4 mg via INTRAVENOUS
  Filled 2016-09-09: qty 2

## 2016-09-09 MED ORDER — TETANUS-DIPHTH-ACELL PERTUSSIS 5-2.5-18.5 LF-MCG/0.5 IM SUSP
0.5000 mL | Freq: Once | INTRAMUSCULAR | Status: AC
  Administered 2016-09-09: 0.5 mL via INTRAMUSCULAR
  Filled 2016-09-09: qty 0.5

## 2016-09-09 MED ORDER — HYDROMORPHONE HCL 1 MG/ML IJ SOLN
1.0000 mg | Freq: Once | INTRAMUSCULAR | Status: AC
  Administered 2016-09-09: 1 mg via INTRAVENOUS
  Filled 2016-09-09: qty 1

## 2016-09-09 MED ORDER — HYDROCODONE-ACETAMINOPHEN 5-325 MG PO TABS: 1.0000 | ORAL_TABLET | ORAL | 0 refills | Status: DC | PRN

## 2016-09-09 MED ORDER — HYDROCODONE-ACETAMINOPHEN 5-325 MG PO TABS
1.0000 | ORAL_TABLET | Freq: Once | ORAL | Status: AC
  Administered 2016-09-09: 1 via ORAL
  Filled 2016-09-09: qty 1

## 2016-09-09 NOTE — Progress Notes (Signed)
Orthopedic Tech Progress Note Patient Details:  Alexandria Bradshaw [birth year] [id number]  Ortho Devices Type of Ortho Device: Ace wrap, Arm sling, Coapt, Sugartong splint Ortho Device/Splint Location: LUE Ortho Device/Splint Interventions: Ordered, Application   Jennye Moccasin 09/09/2016, 11:12 PM
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
## 2016-09-11 ENCOUNTER — Ambulatory Visit (INDEPENDENT_AMBULATORY_CARE_PROVIDER_SITE_OTHER): Payer: Self-pay | Admitting: Orthopaedic Surgery

## 2016-09-11 ENCOUNTER — Encounter (INDEPENDENT_AMBULATORY_CARE_PROVIDER_SITE_OTHER): Payer: Self-pay | Admitting: Orthopaedic Surgery

## 2016-09-11 ENCOUNTER — Ambulatory Visit (INDEPENDENT_AMBULATORY_CARE_PROVIDER_SITE_OTHER): Payer: Self-pay

## 2016-09-11 VITALS — BP 127/73 | HR 94 | Ht 60.0 in | Wt 155.0 lb

## 2016-09-11 DIAGNOSIS — M898X2 Other specified disorders of bone, upper arm: Secondary | ICD-10-CM

## 2016-09-11 DIAGNOSIS — S42322A Displaced transverse fracture of shaft of humerus, left arm, initial encounter for closed fracture: Secondary | ICD-10-CM

## 2016-09-11 DIAGNOSIS — M79622 Pain in left upper arm: Secondary | ICD-10-CM

## 2016-09-12 ENCOUNTER — Encounter (HOSPITAL_COMMUNITY): Payer: Self-pay | Admitting: *Deleted

## 2016-09-12 NOTE — Progress Notes (Signed)
I instructed patient to stop Aleve, and to not take any Aspirin, Aspirin Products or Ibuprofen.  May take Tylenol if needed.

## 2016-09-13 ENCOUNTER — Other Ambulatory Visit (INDEPENDENT_AMBULATORY_CARE_PROVIDER_SITE_OTHER): Payer: Self-pay | Admitting: Orthopaedic Surgery

## 2016-09-13 DIAGNOSIS — S42352G Displaced comminuted fracture of shaft of humerus, left arm, subsequent encounter for fracture with delayed healing: Secondary | ICD-10-CM
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
## 2016-09-16 ENCOUNTER — Encounter (HOSPITAL_COMMUNITY): Payer: Self-pay | Admitting: Certified Registered Nurse Anesthetist

## 2016-09-16 ENCOUNTER — Observation Stay (HOSPITAL_COMMUNITY)
Admission: RE | Admit: 2016-09-16 | Discharge: 2016-09-17 | Disposition: A | Discharge status: Home / Self Care | Payer: Medicaid Other | Source: Ambulatory Visit | Attending: Orthopaedic Surgery | Admitting: Orthopaedic Surgery

## 2016-09-16 ENCOUNTER — Encounter (HOSPITAL_COMMUNITY)
Admission: RE | Disposition: A | Discharge status: Home / Self Care | Payer: Self-pay | Source: Ambulatory Visit | Attending: Orthopaedic Surgery

## 2016-09-16 ENCOUNTER — Ambulatory Visit (HOSPITAL_COMMUNITY): Payer: Medicaid Other | Admitting: Certified Registered Nurse Anesthetist

## 2016-09-16 ENCOUNTER — Ambulatory Visit (HOSPITAL_COMMUNITY): Payer: Medicaid Other

## 2016-09-16 DIAGNOSIS — Z9889 Other specified postprocedural states: Secondary | ICD-10-CM | POA: Diagnosis not present

## 2016-09-16 DIAGNOSIS — S42352G Displaced comminuted fracture of shaft of humerus, left arm, subsequent encounter for fracture with delayed healing: Secondary | ICD-10-CM

## 2016-09-16 DIAGNOSIS — S42322A Displaced transverse fracture of shaft of humerus, left arm, initial encounter for closed fracture: Secondary | ICD-10-CM | POA: Diagnosis not present

## 2016-09-16 DIAGNOSIS — F17210 Nicotine dependence, cigarettes, uncomplicated: Secondary | ICD-10-CM | POA: Insufficient documentation

## 2016-09-16 DIAGNOSIS — S42309A Unspecified fracture of shaft of humerus, unspecified arm, initial encounter for closed fracture: Secondary | ICD-10-CM | POA: Diagnosis present

## 2016-09-16 DIAGNOSIS — S42302A Unspecified fracture of shaft of humerus, left arm, initial encounter for closed fracture: Secondary | ICD-10-CM | POA: Insufficient documentation

## 2016-09-16 DIAGNOSIS — Z833 Family history of diabetes mellitus: Secondary | ICD-10-CM | POA: Insufficient documentation

## 2016-09-16 DIAGNOSIS — Z8249 Family history of ischemic heart disease and other diseases of the circulatory system: Secondary | ICD-10-CM | POA: Diagnosis not present

## 2016-09-16 DIAGNOSIS — Z791 Long term (current) use of non-steroidal anti-inflammatories (NSAID): Secondary | ICD-10-CM | POA: Diagnosis not present

## 2016-09-16 DIAGNOSIS — Z823 Family history of stroke: Secondary | ICD-10-CM | POA: Diagnosis not present

## 2016-09-16 DIAGNOSIS — Z419 Encounter for procedure for purposes other than remedying health state, unspecified: Secondary | ICD-10-CM

## 2016-09-16 HISTORY — DX: Adverse effect of unspecified anesthetic, initial encounter: T41.45XA

## 2016-09-16 HISTORY — DX: Other complications of anesthesia, initial encounter: T88.59XA

## 2016-09-16 HISTORY — PX: ORIF HUMERUS FRACTURE: SHX2126

## 2016-09-16 SURGERY — OPEN REDUCTION INTERNAL FIXATION (ORIF) HUMERAL SHAFT FRACTURE
Anesthesia: General | Site: Arm Upper | Laterality: Left

## 2016-09-16 MED ORDER — OXYCODONE HCL 5 MG PO TABS: 5.0000 mg | ORAL_TABLET | Freq: Once | ORAL | Status: DC | PRN

## 2016-09-16 MED ORDER — CEFAZOLIN SODIUM-DEXTROSE 2-4 GM/100ML-% IV SOLN
2.0000 g | INTRAVENOUS | Status: AC
  Administered 2016-09-16: 2 g via INTRAVENOUS
  Filled 2016-09-16: qty 100

## 2016-09-16 MED ORDER — OXYCODONE HCL 5 MG/5ML PO SOLN: 5.0000 mg | Freq: Once | ORAL | Status: DC | PRN

## 2016-09-16 MED ORDER — METOCLOPRAMIDE HCL 5 MG/ML IJ SOLN: 5.0000 mg | Freq: Three times a day (TID) | INTRAMUSCULAR | Status: DC | PRN

## 2016-09-16 MED ORDER — MIDAZOLAM HCL 2 MG/2ML IJ SOLN
INTRAMUSCULAR | Status: AC
  Administered 2016-09-16: 2 mg via INTRAVENOUS
  Filled 2016-09-16: qty 2

## 2016-09-16 MED ORDER — GLYCOPYRROLATE 0.2 MG/ML IJ SOLN
INTRAMUSCULAR | Status: DC | PRN
  Administered 2016-09-16: 0.4 mg via INTRAVENOUS

## 2016-09-16 MED ORDER — HYDROMORPHONE HCL 1 MG/ML IJ SOLN: 0.5000 mg | INTRAMUSCULAR | Status: DC | PRN

## 2016-09-16 MED ORDER — HYDROMORPHONE HCL 1 MG/ML IJ SOLN: 0.2500 mg | INTRAMUSCULAR | Status: DC | PRN

## 2016-09-16 MED ORDER — DEXAMETHASONE SODIUM PHOSPHATE 10 MG/ML IJ SOLN
INTRAMUSCULAR | Status: AC
  Filled 2016-09-16: qty 1

## 2016-09-16 MED ORDER — ONDANSETRON HCL 4 MG PO TABS: 4.0000 mg | ORAL_TABLET | Freq: Four times a day (QID) | ORAL | Status: DC | PRN

## 2016-09-16 MED ORDER — PHENYLEPHRINE 40 MCG/ML (10ML) SYRINGE FOR IV PUSH (FOR BLOOD PRESSURE SUPPORT)
PREFILLED_SYRINGE | INTRAVENOUS | Status: AC
  Filled 2016-09-16: qty 10

## 2016-09-16 MED ORDER — ONDANSETRON HCL 4 MG/2ML IJ SOLN: 4.0000 mg | Freq: Four times a day (QID) | INTRAMUSCULAR | Status: DC | PRN

## 2016-09-16 MED ORDER — 0.9 % SODIUM CHLORIDE (POUR BTL) OPTIME
TOPICAL | Status: DC | PRN
  Administered 2016-09-16: 1000 mL

## 2016-09-16 MED ORDER — METHOCARBAMOL 500 MG PO TABS
500.0000 mg | ORAL_TABLET | Freq: Four times a day (QID) | ORAL | Status: DC | PRN
  Administered 2016-09-16 – 2016-09-17 (×2): 500 mg via ORAL
  Filled 2016-09-16 (×2): qty 1

## 2016-09-16 MED ORDER — DOCUSATE SODIUM 100 MG PO CAPS
100.0000 mg | ORAL_CAPSULE | Freq: Two times a day (BID) | ORAL | Status: DC
  Administered 2016-09-16: 100 mg via ORAL
  Filled 2016-09-16: qty 1

## 2016-09-16 MED ORDER — FENTANYL CITRATE (PF) 100 MCG/2ML IJ SOLN
INTRAMUSCULAR | Status: AC
  Administered 2016-09-16: 100 ug via INTRAVENOUS
  Filled 2016-09-16: qty 2

## 2016-09-16 MED ORDER — OXYCODONE HCL 5 MG PO TABS
5.0000 mg | ORAL_TABLET | ORAL | Status: DC | PRN
  Administered 2016-09-16 – 2016-09-17 (×3): 5 mg via ORAL
  Filled 2016-09-16 (×3): qty 1

## 2016-09-16 MED ORDER — SODIUM CHLORIDE 0.9 % IV SOLN: INTRAVENOUS | Status: DC

## 2016-09-16 MED ORDER — MIDAZOLAM HCL 2 MG/2ML IJ SOLN
2.0000 mg | Freq: Once | INTRAMUSCULAR | Status: AC
  Administered 2016-09-16: 2 mg via INTRAVENOUS

## 2016-09-16 MED ORDER — ACETAMINOPHEN 650 MG RE SUPP: 650.0000 mg | Freq: Four times a day (QID) | RECTAL | Status: DC | PRN

## 2016-09-16 MED ORDER — PROPOFOL 10 MG/ML IV BOLUS
INTRAVENOUS | Status: DC | PRN
  Administered 2016-09-16: 170 mg via INTRAVENOUS

## 2016-09-16 MED ORDER — PHENYLEPHRINE HCL 10 MG/ML IJ SOLN
INTRAMUSCULAR | Status: DC | PRN
  Administered 2016-09-16: 80 ug via INTRAVENOUS

## 2016-09-16 MED ORDER — ROCURONIUM BROMIDE 100 MG/10ML IV SOLN
INTRAVENOUS | Status: DC | PRN
  Administered 2016-09-16: 50 mg via INTRAVENOUS

## 2016-09-16 MED ORDER — LACTATED RINGERS IV SOLN
INTRAVENOUS | Status: DC
  Administered 2016-09-16: 10:00:00 via INTRAVENOUS

## 2016-09-16 MED ORDER — LIDOCAINE HCL (CARDIAC) 20 MG/ML IV SOLN
INTRAVENOUS | Status: DC | PRN
  Administered 2016-09-16: 60 mg via INTRAVENOUS

## 2016-09-16 MED ORDER — FENTANYL CITRATE (PF) 250 MCG/5ML IJ SOLN
INTRAMUSCULAR | Status: AC
  Filled 2016-09-16: qty 5

## 2016-09-16 MED ORDER — FENTANYL CITRATE (PF) 100 MCG/2ML IJ SOLN
INTRAMUSCULAR | Status: DC | PRN
  Administered 2016-09-16: 50 ug via INTRAVENOUS

## 2016-09-16 MED ORDER — BUPIVACAINE-EPINEPHRINE (PF) 0.5% -1:200000 IJ SOLN
INTRAMUSCULAR | Status: DC | PRN
  Administered 2016-09-16: 30 mL via PERINEURAL

## 2016-09-16 MED ORDER — ONDANSETRON HCL 4 MG/2ML IJ SOLN
INTRAMUSCULAR | Status: AC
  Filled 2016-09-16: qty 2

## 2016-09-16 MED ORDER — ACETAMINOPHEN 325 MG PO TABS: 650.0000 mg | ORAL_TABLET | Freq: Four times a day (QID) | ORAL | Status: DC | PRN

## 2016-09-16 MED ORDER — CHLORHEXIDINE GLUCONATE 4 % EX LIQD: 60.0000 mL | Freq: Once | CUTANEOUS | Status: DC

## 2016-09-16 MED ORDER — METHOCARBAMOL 1000 MG/10ML IJ SOLN: 500.0000 mg | Freq: Four times a day (QID) | INTRAVENOUS | Status: DC | PRN

## 2016-09-16 MED ORDER — METOCLOPRAMIDE HCL 5 MG PO TABS: 5.0000 mg | ORAL_TABLET | Freq: Three times a day (TID) | ORAL | Status: DC | PRN

## 2016-09-16 MED ORDER — DEXAMETHASONE SODIUM PHOSPHATE 10 MG/ML IJ SOLN
INTRAMUSCULAR | Status: DC | PRN
  Administered 2016-09-16: 10 mg via INTRAVENOUS

## 2016-09-16 MED ORDER — LIDOCAINE 2% (20 MG/ML) 5 ML SYRINGE
INTRAMUSCULAR | Status: AC
  Filled 2016-09-16: qty 5

## 2016-09-16 MED ORDER — FENTANYL CITRATE (PF) 100 MCG/2ML IJ SOLN
100.0000 ug | Freq: Once | INTRAMUSCULAR | Status: AC
  Administered 2016-09-16: 100 ug via INTRAVENOUS

## 2016-09-16 MED ORDER — ROCURONIUM BROMIDE 10 MG/ML (PF) SYRINGE
PREFILLED_SYRINGE | INTRAVENOUS | Status: AC
  Filled 2016-09-16: qty 5

## 2016-09-16 MED ORDER — NEOSTIGMINE METHYLSULFATE 10 MG/10ML IV SOLN
INTRAVENOUS | Status: DC | PRN
  Administered 2016-09-16: 3 mg via INTRAVENOUS

## 2016-09-16 MED ORDER — NEOSTIGMINE METHYLSULFATE 5 MG/5ML IV SOSY
PREFILLED_SYRINGE | INTRAVENOUS | Status: AC
  Filled 2016-09-16: qty 5

## 2016-09-16 MED ORDER — MIDAZOLAM HCL 2 MG/2ML IJ SOLN
INTRAMUSCULAR | Status: AC
  Filled 2016-09-16: qty 2

## 2016-09-16 MED ORDER — ONDANSETRON HCL 4 MG/2ML IJ SOLN
INTRAMUSCULAR | Status: DC | PRN
  Administered 2016-09-16: 4 mg via INTRAVENOUS

## 2016-09-16 SURGICAL SUPPLY — 65 items
BANDAGE ACE 4X5 VEL STRL LF (GAUZE/BANDAGES/DRESSINGS) IMPLANT
BANDAGE ELASTIC 4 VELCRO ST LF (GAUZE/BANDAGES/DRESSINGS) ×2 IMPLANT
BIT DRILL QC 3.3X195 (BIT) ×2 IMPLANT
BLADE SURG 10 STRL SS (BLADE) ×2 IMPLANT
BLADE SURG 15 STRL LF DISP TIS (BLADE) ×1 IMPLANT
BLADE SURG 15 STRL SS (BLADE) ×2
BNDG COHESIVE 4X5 TAN STRL (GAUZE/BANDAGES/DRESSINGS) ×2 IMPLANT
BNDG ESMARK 4X9 LF (GAUZE/BANDAGES/DRESSINGS) IMPLANT
BNDG GAUZE ELAST 4 BULKY (GAUZE/BANDAGES/DRESSINGS) ×2 IMPLANT
CORDS BIPOLAR (ELECTRODE) ×2 IMPLANT
COVER SURGICAL LIGHT HANDLE (MISCELLANEOUS) ×2 IMPLANT
DECANTER SPIKE VIAL GLASS SM (MISCELLANEOUS) IMPLANT
DRAPE IMP U-DRAPE 54X76 (DRAPES) ×2 IMPLANT
DRAPE OEC MINIVIEW 54X84 (DRAPES) IMPLANT
DRSG PAD ABDOMINAL 8X10 ST (GAUZE/BANDAGES/DRESSINGS) ×2 IMPLANT
DURAPREP 26ML APPLICATOR (WOUND CARE) ×4 IMPLANT
ELECT REM PT RETURN 9FT ADLT (ELECTROSURGICAL) ×2
ELECTRODE REM PT RTRN 9FT ADLT (ELECTROSURGICAL) ×1 IMPLANT
GAUZE SPONGE 4X4 12PLY STRL (GAUZE/BANDAGES/DRESSINGS) ×2 IMPLANT
GAUZE XEROFORM 1X8 LF (GAUZE/BANDAGES/DRESSINGS) ×2 IMPLANT
GLOVE BIOGEL PI IND STRL 8 (GLOVE) ×2 IMPLANT
GLOVE BIOGEL PI INDICATOR 8 (GLOVE) ×2
GLOVE ORTHO TXT STRL SZ7.5 (GLOVE) ×4 IMPLANT
GOWN STRL REUS W/ TWL LRG LVL3 (GOWN DISPOSABLE) ×1 IMPLANT
GOWN STRL REUS W/ TWL XL LVL3 (GOWN DISPOSABLE) ×1 IMPLANT
GOWN STRL REUS W/TWL 2XL LVL3 (GOWN DISPOSABLE) ×2 IMPLANT
GOWN STRL REUS W/TWL LRG LVL3 (GOWN DISPOSABLE) ×1
GOWN STRL REUS W/TWL XL LVL3 (GOWN DISPOSABLE) ×1
K-WIRE 2.0 (WIRE) ×1
K-WIRE FXSTD 280X2XNS SS (WIRE) ×1
K-WIRE TROCAR 1.6X150 (WIRE) ×2
KIT BASIN OR (CUSTOM PROCEDURE TRAY) ×2 IMPLANT
KIT ROOM TURNOVER OR (KITS) ×2 IMPLANT
KWIRE FXSTD 280X2XNS SS (WIRE) ×1 IMPLANT
KWIRE TROCAR 1.6X150 (WIRE) ×1 IMPLANT
MANIFOLD NEPTUNE II (INSTRUMENTS) ×2 IMPLANT
NEEDLE 22X1 1/2 (OR ONLY) (NEEDLE) IMPLANT
NS IRRIG 1000ML POUR BTL (IV SOLUTION) ×2 IMPLANT
PACK ORTHO EXTREMITY (CUSTOM PROCEDURE TRAY) ×2 IMPLANT
PACK UNIVERSAL I (CUSTOM PROCEDURE TRAY) IMPLANT
PAD ARMBOARD 7.5X6 YLW CONV (MISCELLANEOUS) ×4 IMPLANT
PAD CAST 4YDX4 CTTN HI CHSV (CAST SUPPLIES) ×1 IMPLANT
PADDING CAST COTTON 4X4 STRL (CAST SUPPLIES) ×1
PLATE 8-HOLE STRAIGHT NCB (Plate) ×2 IMPLANT
SCREW NCB 4.0 22MM (Screw) ×4 IMPLANT
SCREW NCB 4.0X20MM (Screw) ×12 IMPLANT
SPONGE LAP 4X18 X RAY DECT (DISPOSABLE) ×2 IMPLANT
STAPLER VISISTAT 35W (STAPLE) ×2 IMPLANT
STOCKINETTE IMPERVIOUS 9X36 MD (GAUZE/BANDAGES/DRESSINGS) ×2 IMPLANT
STRIP CLOSURE SKIN 1/2X4 (GAUZE/BANDAGES/DRESSINGS) IMPLANT
SUCTION FRAZIER HANDLE 10FR (MISCELLANEOUS) ×1
SUCTION TUBE FRAZIER 10FR DISP (MISCELLANEOUS) ×1 IMPLANT
SUT PROLENE 3 0 PS 1 (SUTURE) ×2 IMPLANT
SUT STEEL 5 (SUTURE) IMPLANT
SUT VIC AB 2-0 CT1 27 (SUTURE) ×2
SUT VIC AB 2-0 CT1 TAPERPNT 27 (SUTURE) ×2 IMPLANT
SUT VIC AB 3-0 X1 27 (SUTURE) ×2 IMPLANT
SUT VICRYL 4-0 PS2 18IN ABS (SUTURE) ×2 IMPLANT
SYR CONTROL 10ML LL (SYRINGE) IMPLANT
TOWEL OR 17X24 6PK STRL BLUE (TOWEL DISPOSABLE) ×2 IMPLANT
TOWEL OR 17X26 10 PK STRL BLUE (TOWEL DISPOSABLE) ×2 IMPLANT
TUBE CONNECTING 12X1/4 (SUCTIONS) ×2 IMPLANT
UNDERPAD 30X30 (UNDERPADS AND DIAPERS) ×2 IMPLANT
WATER STERILE IRR 1000ML POUR (IV SOLUTION) IMPLANT
YANKAUER SUCT BULB TIP NO VENT (SUCTIONS) ×2 IMPLANT
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
## 2016-09-16 NOTE — Brief Op Note (Signed)
09/16/2016  2:40 PM  PATIENT:  Alexandria Bradshaw  26 y.o. female  PRE-OPERATIVE DIAGNOSIS:  Displaced Left Humerus Shaft Fracture  POST-OPERATIVE DIAGNOSIS:  Displaced Left Humerus Shaft Fracture  PROCEDURE:  Procedure(s): OPEN REDUCTION INTERNAL FIXATION (ORIF) DISTAL HUMERAL SHAFT FRACTURE (Left)  SURGEON:  Surgeon(s) and Role:    * Eldred Manges, MD - Primary  PHYSICIAN ASSISTANT: Zonia Kief    ANESTHESIA:   regional and general  EBL:  Total I/O In: 800 [I.V.:800] Out: 25 [Blood:25]  BLOOD ADMINISTERED:none  DRAINS: none   LOCAL MEDICATIONS USED:  NONE  SPECIMEN:  No Specimen  DISPOSITION OF SPECIMEN:  N/A  COUNTS:  YES  TOURNIQUET:  * No tourniquets in log *  DICTATION: .Dragon Dictation  PLAN OF CARE: Admit for overnight observation  PATIENT DISPOSITION:  PACU - hemodynamically stable.
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
## 2016-09-16 NOTE — Anesthesia Procedure Notes (Signed)
Anesthesia Regional Block: Interscalene brachial plexus block   Pre-Anesthetic Checklist: ,, timeout performed, Correct Patient, Correct Site, Correct Laterality, Correct Procedure, Correct Position, site marked, Risks and benefits discussed,  Surgical consent,  Pre-op evaluation,  At surgeon's request and post-op pain management  Laterality: Left  Prep: chloraprep       Needles:  Injection technique: Single-shot  Needle Type: Echogenic Stimulator Needle     Needle Length: 5cm  Needle Gauge: 22     Additional Needles:   Procedures: ultrasound guided, nerve stimulator,,,,,,   Nerve Stimulator or Paresthesia:  Response: biceps flexion, 0.45 mA,   Additional Responses:   Narrative:  Start time: 09/16/2016 11:20 AM End time: 09/16/2016 11:26 AM Injection made incrementally with aspirations every 5 mL.  Performed by: Personally  Anesthesiologist: Treyce Spillers  Additional Notes: Functioning IV was confirmed and monitors were applied.  A 50mm 22ga Arrow echogenic stimulator needle was used. Sterile prep and drape,hand hygiene and sterile gloves were used.  Negative aspiration and negative test dose prior to incremental administration of local anesthetic. The patient tolerated the procedure well.  Ultrasound guidance: relevent anatomy identified, needle position confirmed, local anesthetic spread visualized around nerve(s), vascular puncture avoided.  Image printed for medical record.

## 2016-09-16 NOTE — Transfer of Care (Signed)
Immediate Anesthesia Transfer of Care Note  Patient: Alexandria Bradshaw  Procedure(s) Performed: Procedure(s): OPEN REDUCTION INTERNAL FIXATION (ORIF) DISTAL HUMERAL SHAFT FRACTURE (Left)  Patient Location: PACU  Anesthesia Type:GA combined with regional for post-op pain  Level of Consciousness: awake, alert  and oriented  Airway & Oxygen Therapy: Patient Spontanous Breathing and Patient connected to nasal cannula oxygen  Post-op Assessment: Report given to RN and Post -op Vital signs reviewed and stable  Post vital signs: Reviewed and stable  Last Vitals:  Vitals:   09/16/16 1145 09/16/16 1150  BP: 127/68 116/63  Pulse: 80 72  Resp: 17 17  Temp:      Last Pain:  Vitals:   09/16/16 1014  TempSrc:   PainSc: 8       Patients Stated Pain Goal: 5 (09/16/16 1014)  Complications: No apparent anesthesia complications

## 2016-09-16 NOTE — OR Nursing (Signed)
Led applied for second set of x-rays per surgeon

## 2016-09-16 NOTE — OR Nursing (Signed)
Surgeon refused to allow x-ray tech and RN to apply X-ray led to patient during the case for coverage from radiation.  He was questioned and reminded of the possible outcomes of radiation to patient.

## 2016-09-16 NOTE — Interval H&P Note (Signed)
History and Physical Interval Note:  09/16/2016 12:17 PM  Alexandria Bradshaw  has presented today for surgery, with the diagnosis of Displaced Left Humerus Shaft Fracture  The various methods of treatment have been discussed with the patient and family. After consideration of risks, benefits and other options for treatment, the patient has consented to  Procedure(s): OPEN REDUCTION INTERNAL FIXATION (ORIF) DISTAL HUMERAL SHAFT FRACTURE (Left) as a surgical intervention .  The patient's history has been reviewed, patient examined, no change in status, stable for surgery.  I have reviewed the patient's chart and labs.  Questions were answered to the patient's satisfaction.     Eldred Manges

## 2016-09-16 NOTE — Anesthesia Procedure Notes (Signed)
Procedure Name: Intubation Date/Time: 09/16/2016 12:36 PM Performed by: Candis Shine Pre-anesthesia Checklist: Patient identified, Emergency Drugs available, Suction available and Patient being monitored Patient Re-evaluated:Patient Re-evaluated prior to inductionOxygen Delivery Method: Circle System Utilized Preoxygenation: Pre-oxygenation with 100% oxygen Intubation Type: IV induction Ventilation: Mask ventilation without difficulty Laryngoscope Size: Mac and 3 Grade View: Grade I Tube type: Oral Tube size: 7.0 mm Number of attempts: 1 Airway Equipment and Method: Stylet Placement Confirmation: ETT inserted through vocal cords under direct vision,  positive ETCO2 and breath sounds checked- equal and bilateral Secured at: 22 cm Tube secured with: Tape Dental Injury: Teeth and Oropharynx as per pre-operative assessment

## 2016-09-16 NOTE — Interval H&P Note (Signed)
History and Physical Interval Note:  09/16/2016 12:18 PM  Alexandria Bradshaw  has presented today for surgery, with the diagnosis of Displaced Left Humerus Shaft Fracture  The various methods of treatment have been discussed with the patient and family. After consideration of risks, benefits and other options for treatment, the patient has consented to  Procedure(s): OPEN REDUCTION INTERNAL FIXATION (ORIF) DISTAL HUMERAL SHAFT FRACTURE (Left) as a surgical intervention .  The patient's history has been reviewed, patient examined, no change in status, stable for surgery.  I have reviewed the patient's chart and labs.  Questions were answered to the patient's satisfaction.     Eldred Manges
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
## 2016-09-17 DIAGNOSIS — S42322A Displaced transverse fracture of shaft of humerus, left arm, initial encounter for closed fracture: Secondary | ICD-10-CM | POA: Diagnosis not present

## 2016-09-17 MED ORDER — OXYCODONE-ACETAMINOPHEN 5-325 MG PO TABS: 1.0000 | ORAL_TABLET | ORAL | 0 refills | Status: DC | PRN

## 2016-09-17 NOTE — Anesthesia Postprocedure Evaluation (Signed)
Anesthesia Post Note  Patient: Alexandria Bradshaw  Procedure(s) Performed: Procedure(s) (LRB): OPEN REDUCTION INTERNAL FIXATION (ORIF) DISTAL HUMERAL SHAFT FRACTURE (Left)  Patient location during evaluation: PACU Anesthesia Type: General Level of consciousness: awake and alert and patient cooperative Pain management: pain level controlled Vital Signs Assessment: post-procedure vital signs reviewed and stable Respiratory status: spontaneous breathing and respiratory function stable Cardiovascular status: stable Anesthetic complications: no       Last Vitals:  Vitals:   09/16/16 2314 09/17/16 0356  BP: 110/65 (!) 109/54  Pulse: 83 60  Resp: 18 17  Temp: 36.9 C 36.8 C    Last Pain:  Vitals:   09/17/16 0437  TempSrc:   PainSc: 5                  Kaedyn Polivka S

## 2016-09-17 NOTE — Progress Notes (Signed)
   Subjective: 1 Day Post-Op Procedure(s) (LRB): OPEN REDUCTION INTERNAL FIXATION (ORIF) DISTAL HUMERAL SHAFT FRACTURE (Left) Patient reports pain as mild. Block wearing off.  Objective: Vital signs in last 24 hours: Temp:  [97.7 F (36.5 C)-98.8 F (37.1 C)] 98.2 F (36.8 C) (05/01 0356) Pulse Rate:  [58-93] 60 (05/01 0356) Resp:  [10-22] 17 (05/01 0356) BP: (109-136)/(54-100) 109/54 (05/01 0356) SpO2:  [97 %-100 %] 99 % (05/01 0356) Weight:  [155 lb (70.3 kg)] 155 lb (70.3 kg) (04/30 0955)  Intake/Output from previous day: 04/30 0701 - 05/01 0700 In: 800 [I.V.:800] Out: 25 [Blood:25] Intake/Output this shift: No intake/output data recorded.  No results for input(s): HGB in the last 72 hours. No results for input(s): WBC, RBC, HCT, PLT in the last 72 hours. No results for input(s): NA, K, CL, CO2, BUN, CREATININE, GLUCOSE, CALCIUM in the last 72 hours. No results for input(s): LABPT, INR in the last 72 hours.  Neurologically intact  EPL and EDC active. Intact radial sensory nerve sensation Dg Humerus Left  Result Date: 09/16/2016 CLINICAL DATA:  Status post ORIF of the left humerus. Fluoro time reported is 4 seconds EXAM: DG C-ARM 61-120 MIN; LEFT HUMERUS - 2+ VIEW COMPARISON:  Pre reduction radiographs dated September 09, 2016 FINDINGS: The patient has undergone ORIF of the fracture of the junction of the middle and distal thirds of the left humerus. Alignment is now near anatomic. The plate and screw fixation appliance is in reasonable position radiographically. IMPRESSION: The patient has undergone successful ORIF of the fracture of the left humeral shaft. Electronically Signed   By: David  [place] M.D.   On: 09/16/2016 14:20   Dg C-arm 1-60 Min  Result Date: 09/16/2016 CLINICAL DATA:  Status post ORIF of the left humerus. Fluoro time reported is 4 seconds EXAM: DG C-ARM 61-120 MIN; LEFT HUMERUS - 2+ VIEW COMPARISON:  Pre reduction radiographs dated September 09, 2016 FINDINGS: The  patient has undergone ORIF of the fracture of the junction of the middle and distal thirds of the left humerus. Alignment is now near anatomic. The plate and screw fixation appliance is in reasonable position radiographically. IMPRESSION: The patient has undergone successful ORIF of the fracture of the left humeral shaft. Electronically Signed   By: David  [place] M.D.   On: 09/16/2016 14:20    Assessment/Plan: 1 Day Post-Op Procedure(s) (LRB): OPEN REDUCTION INTERNAL FIXATION (ORIF) DISTAL HUMERAL SHAFT FRACTURE (Left) Plan;  D/c home office 1 to 2 wks. Rx percocet.  Eldred Manges 09/17/2016, 7:43 AM

## 2016-09-17 NOTE — Progress Notes (Signed)
Patient is discharged from room 3C04 at this time. Alert and in stable condition. IV site d/c'd and instructions given to patient and mother with understanding verbalized. Left unit via wheelchair with all belongings at side.

## 2016-09-17 NOTE — Discharge Instructions (Signed)
May remove dressing in 48 hrs and shower then reapply sling. OK to get staples wet with shower. See Dr. Ophelia Charter in one to 2 wks.

## 2016-09-18 ENCOUNTER — Encounter (HOSPITAL_COMMUNITY): Payer: Self-pay | Admitting: Orthopaedic Surgery

## 2016-09-24 ENCOUNTER — Ambulatory Visit (INDEPENDENT_AMBULATORY_CARE_PROVIDER_SITE_OTHER): Payer: Self-pay | Admitting: Orthopaedic Surgery

## 2016-09-24 ENCOUNTER — Telehealth (INDEPENDENT_AMBULATORY_CARE_PROVIDER_SITE_OTHER): Payer: Self-pay | Admitting: Orthopaedic Surgery

## 2016-09-24 NOTE — Telephone Encounter (Signed)
Patient called and canceled her appointment today because she did not have transportation.   Patient has staples to be removed. The number to contact patient is [phone]

## 2016-09-27 ENCOUNTER — Emergency Department (HOSPITAL_COMMUNITY): Payer: Medicaid Other

## 2016-09-27 ENCOUNTER — Encounter (HOSPITAL_COMMUNITY): Payer: Self-pay

## 2016-09-27 ENCOUNTER — Emergency Department (HOSPITAL_COMMUNITY)
Admission: EM | Admit: 2016-09-27 | Discharge: 2016-09-27 | Disposition: A | Discharge status: Home / Self Care | Payer: Medicaid Other | Attending: Emergency Medicine | Admitting: Emergency Medicine

## 2016-09-27 DIAGNOSIS — W1830XA Fall on same level, unspecified, initial encounter: Secondary | ICD-10-CM | POA: Insufficient documentation

## 2016-09-27 DIAGNOSIS — Y929 Unspecified place or not applicable: Secondary | ICD-10-CM | POA: Insufficient documentation

## 2016-09-27 DIAGNOSIS — Z79899 Other long term (current) drug therapy: Secondary | ICD-10-CM | POA: Diagnosis not present

## 2016-09-27 DIAGNOSIS — Y939 Activity, unspecified: Secondary | ICD-10-CM | POA: Diagnosis not present

## 2016-09-27 DIAGNOSIS — M79602 Pain in left arm: Secondary | ICD-10-CM | POA: Diagnosis present

## 2016-09-27 DIAGNOSIS — F17210 Nicotine dependence, cigarettes, uncomplicated: Secondary | ICD-10-CM | POA: Diagnosis not present

## 2016-09-27 DIAGNOSIS — Y999 Unspecified external cause status: Secondary | ICD-10-CM | POA: Diagnosis not present

## 2016-09-27 NOTE — Telephone Encounter (Signed)
I have attempted to reach patient x 3. Please work in to Dr. Ophelia CharterYates schedule if she calls back.

## 2016-09-27 NOTE — ED Provider Notes (Signed)
[facility]ENCY DEPT Provider Note   CSN: [id number] Arrival date & time: 09/27/16  0557     History   Chief Complaint Chief Complaint  Patient presents with  . Arm Pain    HPI Alexandria Bradshaw is a 26 y.o. female.  Patient presents with concerns over possible repeat injury of her left arm. Patient had surgery for open reduction internal fixation of left humerus fracture on April 30. She reports that she fell and is having increased pain and the area seems swollen. No redness, drainage from surgical site.      Past Medical History:  Diagnosis Date  . Complication of anesthesia   . Depression   . Herpes   . Hx of chlamydia infection     Patient Active Problem List   Diagnosis Date Noted  . Humerus fracture 09/16/2016  . Fracture of humeral shaft, left, closed   . 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 ORIF HUMERUS FRACTURE Left 09/16/2016   Procedure: OPEN REDUCTION INTERNAL FIXATION (ORIF) DISTAL HUMERAL SHAFT FRACTURE;  Surgeon: Eldred Manges, MD;  Location: [facility] OR;  Service: Orthopedics;  Laterality: Left;  . WISDOM TOOTH EXTRACTION      OB History    Gravida Para Term Preterm AB Living   1 1 1     1    SAB TAB Ectopic Multiple Live Births           1       Home Medications    Prior to Admission medications   Medication Sig Start Date End Date Taking? Authorizing Provider  acetaminophen (TYLENOL) 325 MG tablet Take 650 mg by mouth every 6 (six) hours as needed.   Yes [provider]  naproxen sodium (ANAPROX) 220 MG tablet Take 440 mg by mouth daily as needed (pain).    [provider]  oxyCODONE-acetaminophen (ROXICET) 5-325 MG tablet Take 1-2 tablets by mouth every 4 (four) hours as needed for severe pain. 09/17/16   Eldred Manges, MD    Family History Family History  Problem Relation Age of Onset  . Hypertension Other   . Diabetes Other   . Stroke Other      Social History Social History  Substance Use Topics  . Smoking status: Current Every Day Smoker    Packs/day: 0.50    Years: 8.00    Types: Cigarettes  . Smokeless tobacco: Never Used  . Alcohol use No     Allergies   No known allergies   Review of Systems Review of Systems  Musculoskeletal: Positive for myalgias.  Skin: Positive for wound.     Physical Exam Updated Vital Signs BP 132/76 (BP Location: Right Arm)   Pulse (!) 102   Temp 98.7 F (37.1 C) (Oral)   Resp 18   Ht 5\' 1"  (1.549 m)   Wt 145 lb (65.8 kg)   LMP 09/12/2016   SpO2 98%   BMI 27.40 kg/m   Physical Exam  Constitutional: She appears well-developed.  HENT:  Head: Normocephalic and atraumatic.  Eyes: Pupils are equal, round, and reactive to light.  Neck: Normal range of motion.  Cardiovascular: Normal rate.   Pulmonary/Chest: Effort normal and breath sounds normal.  Abdominal: Soft.  Musculoskeletal:       Left upper arm: She exhibits tenderness.  Skin:  Surgical incision anterior aspect of left upper arm with staples intact. No erythema, no drainage.  ED Treatments / Results  Labs (all labs ordered are listed, but only abnormal results are displayed) Labs Reviewed - No data to display  EKG  EKG Interpretation None       Radiology No results found.  Procedures Procedures (including critical care time)  Medications Ordered in ED Medications - No data to display   Initial Impression / Assessment and Plan / ED Course  I have reviewed the triage vital signs and the nursing notes.  Pertinent labs & imaging results that were available during my care of the patient were reviewed by me and considered in my medical decision making (see chart for details).     Patient complaining of increased left upper arm pain after a fall. Patient underwent open reduction internal fixation on April 30 after a distal humerus fracture. Patient feels like the arm is more swollen, but I do  not see any significant swelling or deformity. Surgical incision appears to be healing well and there is no concern for infection. X-ray obtained reveals no new injury, excellent alignment from previous surgery. Patient reassured, continue rest and analgesia.  Final Clinical Impressions(s) / ED Diagnoses   Final diagnoses:  Left arm pain    New Prescriptions New Prescriptions   No medications on file     Gilda CreasePollina, Javaughn Opdahl J, MD 09/27/16 [phone]
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
## 2016-09-30 ENCOUNTER — Ambulatory Visit (INDEPENDENT_AMBULATORY_CARE_PROVIDER_SITE_OTHER): Payer: Self-pay | Admitting: Orthopaedic Surgery
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
## 2016-10-03 ENCOUNTER — Ambulatory Visit (INDEPENDENT_AMBULATORY_CARE_PROVIDER_SITE_OTHER): Payer: Self-pay | Admitting: Orthopaedic Surgery

## 2016-10-03 VITALS — Ht 61.0 in | Wt 145.0 lb

## 2016-10-03 DIAGNOSIS — S42322D Displaced transverse fracture of shaft of humerus, left arm, subsequent encounter for fracture with routine healing: Secondary | ICD-10-CM
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
## 2016-10-09 ENCOUNTER — Ambulatory Visit (INDEPENDENT_AMBULATORY_CARE_PROVIDER_SITE_OTHER): Payer: Self-pay | Admitting: Orthopaedic Surgery

## 2016-10-10 ENCOUNTER — Ambulatory Visit (INDEPENDENT_AMBULATORY_CARE_PROVIDER_SITE_OTHER): Payer: Self-pay | Admitting: Orthopaedic Surgery

## 2016-10-31 ENCOUNTER — Inpatient Hospital Stay (INDEPENDENT_AMBULATORY_CARE_PROVIDER_SITE_OTHER): Payer: Self-pay | Admitting: Orthopaedic Surgery

## 2016-11-07 ENCOUNTER — Inpatient Hospital Stay (INDEPENDENT_AMBULATORY_CARE_PROVIDER_SITE_OTHER): Payer: Self-pay | Admitting: Orthopaedic Surgery

## 2017-04-05 ENCOUNTER — Encounter (HOSPITAL_COMMUNITY): Payer: Self-pay | Admitting: Emergency Medicine

## 2017-04-05 ENCOUNTER — Emergency Department (HOSPITAL_COMMUNITY)
Admission: EM | Admit: 2017-04-05 | Discharge: 2017-04-05 | Disposition: A | Discharge status: Home / Self Care | Payer: Self-pay | Attending: Emergency Medicine | Admitting: Emergency Medicine

## 2017-04-05 DIAGNOSIS — Z5321 Procedure and treatment not carried out due to patient leaving prior to being seen by health care provider: Secondary | ICD-10-CM | POA: Insufficient documentation

## 2017-04-05 DIAGNOSIS — R112 Nausea with vomiting, unspecified: Secondary | ICD-10-CM | POA: Insufficient documentation

## 2017-04-05 DIAGNOSIS — N644 Mastodynia: Secondary | ICD-10-CM | POA: Insufficient documentation

## 2017-04-05 LAB — POC URINE PREG, ED: Preg Test, Ur: POSITIVE — AB

## 2017-04-05 NOTE — ED Notes (Signed)
Pt seen by registration walking out of ED away from hospital.  Assuming pt left.

## 2017-04-05 NOTE — ED Triage Notes (Signed)
Pt reports she has had N/V/ and breast pain X1 month. States LMP approx 02/13/17, has had unprotected sex. Denies drainage, redness, or rippling of breast. States she has had 1 episode of V/ today.
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
## 2018-08-31 IMAGING — RF DG HUMERUS 2V *L*
1 series · 2 of 2 positions shown · non-contrast
Comparison: Pre reduction radiographs dated September 09, 2016

CLINICAL DATA: Status post ORIF of the left humerus. Fluoro time
reported is 4 seconds

EXAM:
DG C-ARM 61-120 MIN; LEFT HUMERUS - 2+ VIEW

[Series 1: run · 2 of 2 slices shown]
[im 1/2]
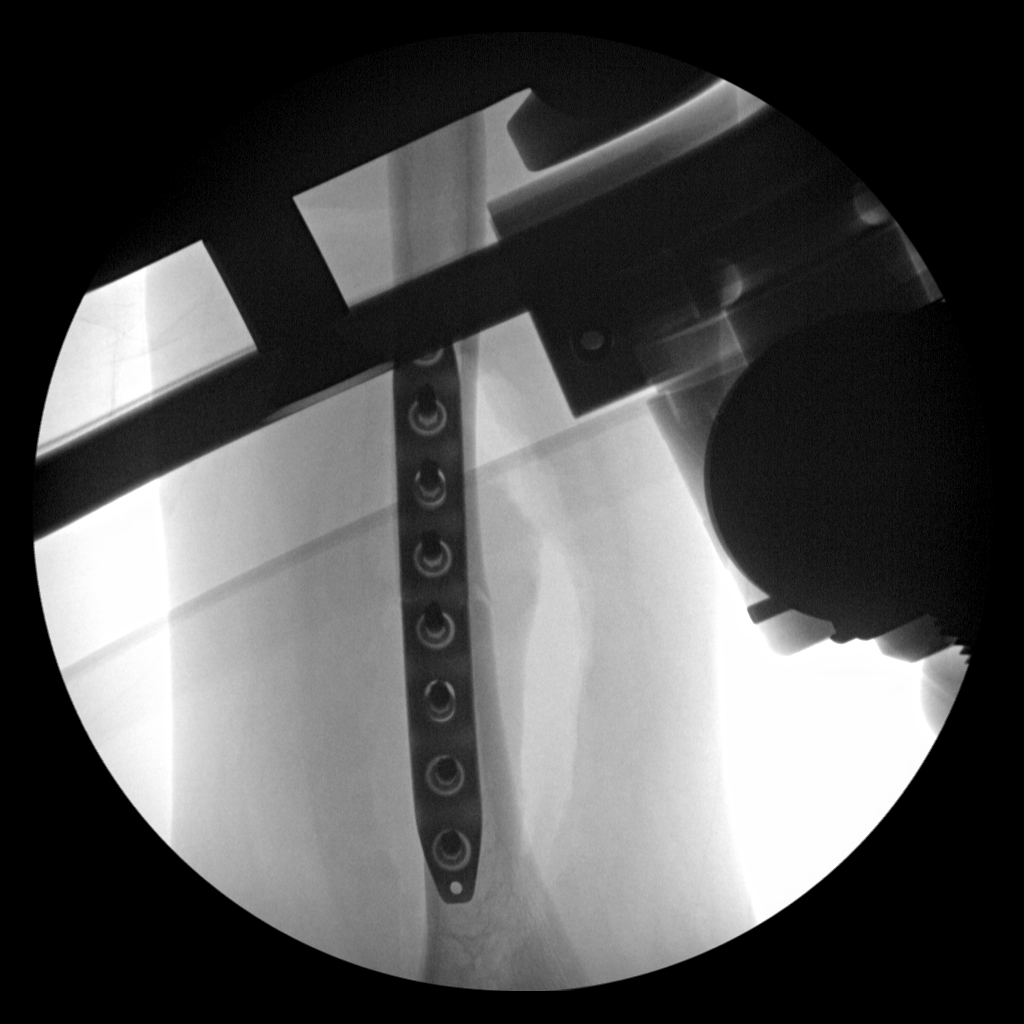
[im 2/2]
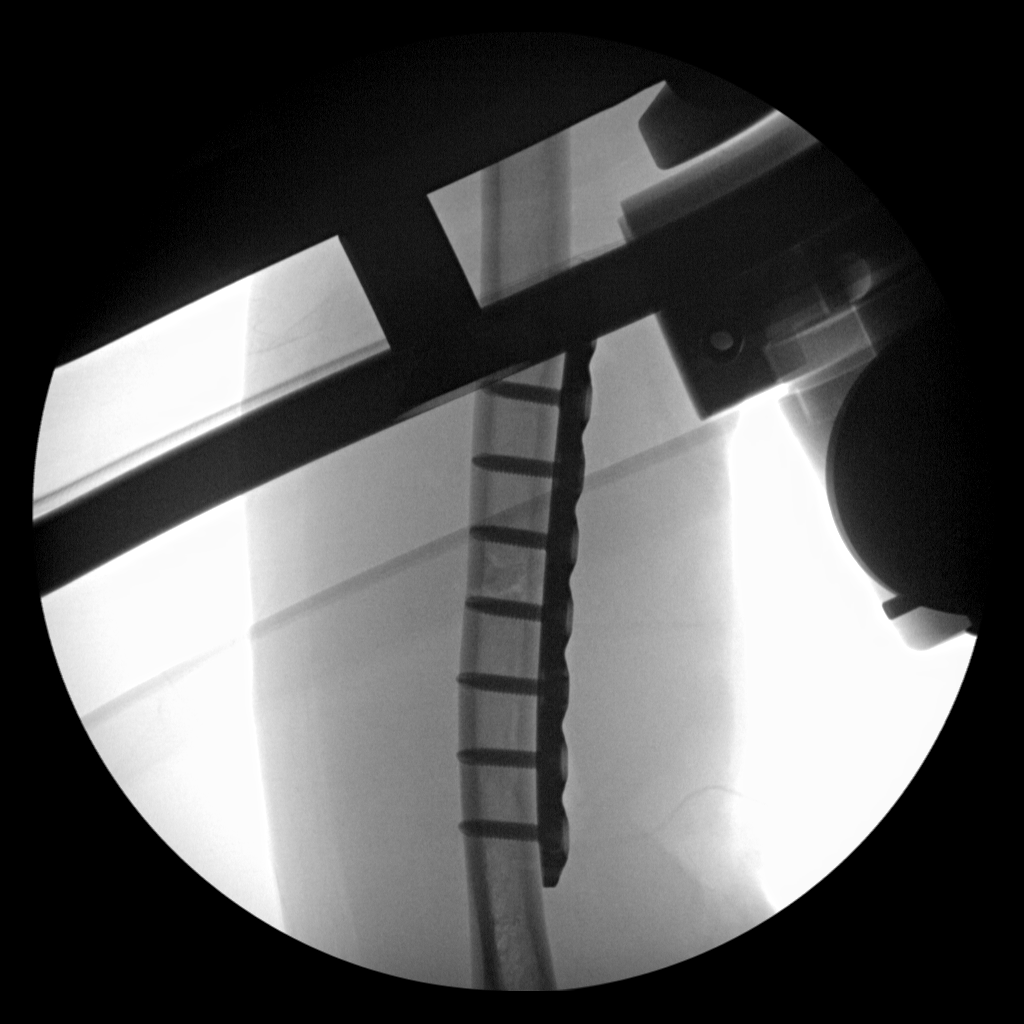

[2 of 2 positions shown; findings below may reference images not displayed]

FINDINGS: The patient has undergone ORIF of the fracture of the junction of
the middle and distal thirds of the left humerus. Alignment is now
near anatomic. The plate and screw fixation appliance is in
reasonable position radiographically.
IMPRESSION: The patient has undergone successful ORIF of the fracture of the
left humeral shaft.

## 2021-09-22 ENCOUNTER — Emergency Department (HOSPITAL_COMMUNITY): Payer: Self-pay

## 2021-09-22 ENCOUNTER — Encounter (HOSPITAL_COMMUNITY): Payer: Self-pay

## 2021-09-22 ENCOUNTER — Emergency Department (HOSPITAL_COMMUNITY)
Admission: EM | Admit: 2021-09-22 | Discharge: 2021-09-22 | Disposition: A | Discharge status: Home / Self Care | Payer: Self-pay | Attending: Emergency Medicine | Admitting: Emergency Medicine

## 2021-09-22 ENCOUNTER — Other Ambulatory Visit: Payer: Self-pay

## 2021-09-22 DIAGNOSIS — S59911A Unspecified injury of right forearm, initial encounter: Secondary | ICD-10-CM | POA: Diagnosis present

## 2021-09-22 DIAGNOSIS — W19XXXA Unspecified fall, initial encounter: Secondary | ICD-10-CM

## 2021-09-22 DIAGNOSIS — S5011XA Contusion of right forearm, initial encounter: Secondary | ICD-10-CM | POA: Insufficient documentation

## 2021-09-22 DIAGNOSIS — S0093XA Contusion of unspecified part of head, initial encounter: Secondary | ICD-10-CM | POA: Diagnosis not present

## 2021-09-22 DIAGNOSIS — R519 Headache, unspecified: Secondary | ICD-10-CM | POA: Diagnosis not present

## 2021-09-22 NOTE — Discharge Instructions (Signed)
Tylenol or Motrin for pain.  Follow-up with Dr. Romeo Apple for your wrist if it continues to bother you ?
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
## 2021-09-24 ENCOUNTER — Telehealth: Payer: Self-pay

## 2021-09-24 NOTE — Telephone Encounter (Signed)
Called patient to offer an appointment, but only got message stating, " that ?call could not be completed at this time". I will try to call again. ?

## 2021-09-28 ENCOUNTER — Emergency Department (HOSPITAL_COMMUNITY)
Admission: EM | Admit: 2021-09-28 | Discharge: 2021-09-29 | Disposition: A | Discharge status: Home / Self Care | Payer: Self-pay | Attending: Emergency Medicine | Admitting: Emergency Medicine

## 2021-09-28 ENCOUNTER — Encounter (HOSPITAL_COMMUNITY): Payer: Self-pay | Admitting: Emergency Medicine

## 2021-09-28 ENCOUNTER — Other Ambulatory Visit: Payer: Self-pay

## 2021-09-28 DIAGNOSIS — Y908 Blood alcohol level of 240 mg/100 ml or more: Secondary | ICD-10-CM | POA: Insufficient documentation

## 2021-09-28 DIAGNOSIS — F10929 Alcohol use, unspecified with intoxication, unspecified: Secondary | ICD-10-CM

## 2021-09-28 DIAGNOSIS — F1010 Alcohol abuse, uncomplicated: Secondary | ICD-10-CM | POA: Insufficient documentation

## 2021-09-28 DIAGNOSIS — S0181XA Laceration without foreign body of other part of head, initial encounter: Secondary | ICD-10-CM | POA: Insufficient documentation

## 2021-09-28 DIAGNOSIS — R45851 Suicidal ideations: Secondary | ICD-10-CM | POA: Insufficient documentation

## 2021-09-28 DIAGNOSIS — X58XXXA Exposure to other specified factors, initial encounter: Secondary | ICD-10-CM | POA: Insufficient documentation

## 2021-09-28 LAB — CBC WITH DIFFERENTIAL/PLATELET
Abs Immature Granulocytes: 0.05 10*3/uL (ref 0.00–0.07)
Basophils Absolute: 0 10*3/uL (ref 0.0–0.1)
Basophils Relative: 0 %
Eosinophils Absolute: 0 10*3/uL (ref 0.0–0.5)
Eosinophils Relative: 0 %
HCT: 38.2 % (ref 36.0–46.0)
Hemoglobin: 12.7 g/dL (ref 12.0–15.0)
Immature Granulocytes: 0 %
Lymphocytes Relative: 32 %
Lymphs Abs: 3.9 10*3/uL (ref 0.7–4.0)
MCH: 31.8 pg (ref 26.0–34.0)
MCHC: 33.2 g/dL (ref 30.0–36.0)
MCV: 95.7 fL (ref 80.0–100.0)
Monocytes Absolute: 0.4 10*3/uL (ref 0.1–1.0)
Monocytes Relative: 4 %
Neutro Abs: 7.7 10*3/uL (ref 1.7–7.7)
Neutrophils Relative %: 64 %
Platelets: 283 10*3/uL (ref 150–400)
RBC: 3.99 MIL/uL (ref 3.87–5.11)
RDW: 16.5 % — ABNORMAL HIGH (ref 11.5–15.5)
WBC: 12.1 10*3/uL — ABNORMAL HIGH (ref 4.0–10.5)
nRBC: 0 % (ref 0.0–0.2)

## 2021-09-28 LAB — COMPREHENSIVE METABOLIC PANEL
ALT: 19 U/L (ref 0–44)
AST: 34 U/L (ref 15–41)
Albumin: 4.5 g/dL (ref 3.5–5.0)
Alkaline Phosphatase: 49 U/L (ref 38–126)
Anion gap: 11 (ref 5–15)
BUN: 11 mg/dL (ref 6–20)
CO2: 21 mmol/L — ABNORMAL LOW (ref 22–32)
Calcium: 9 mg/dL (ref 8.9–10.3)
Chloride: 111 mmol/L (ref 98–111)
Creatinine, Ser: 0.69 mg/dL (ref 0.44–1.00)
GFR, Estimated: >60 mL/min (ref >60)
Glucose, Bld: 82 mg/dL (ref 70–99)
Potassium: 3.5 mmol/L (ref 3.5–5.1)
Sodium: 143 mmol/L (ref 135–145)
Total Bilirubin: 0.7 mg/dL (ref 0.3–1.2)
Total Protein: 8 g/dL (ref 6.5–8.1)

## 2021-09-28 LAB — ETHANOL: Alcohol, Ethyl (B): 321 mg/dL (ref <10)

## 2021-09-28 MED ORDER — LIDOCAINE HCL (PF) 2 % IJ SOLN
INTRAMUSCULAR | Status: AC
  Administered 2021-09-28: 5 mL
  Filled 2021-09-28: qty 20

## 2021-09-28 MED ORDER — LIDOCAINE-EPINEPHRINE-TETRACAINE (LET) TOPICAL GEL
3.0000 mL | Freq: Once | TOPICAL | Status: AC
  Administered 2021-09-28: 3 mL via TOPICAL
  Filled 2021-09-28: qty 3

## 2021-09-28 MED ORDER — LIDOCAINE HCL (PF) 2 % IJ SOLN: 5.0000 mL | Freq: Once | INTRAMUSCULAR | Status: AC

## 2021-09-28 MED ORDER — POVIDONE-IODINE 10 % EX SOLN
CUTANEOUS | Status: DC | PRN
  Filled 2021-09-28: qty 14.8

## 2021-09-28 NOTE — ED Notes (Signed)
Pt placed in vertical treatment area with two family member. Pt given gauze to hold pressure to chin. Consulting civil engineer notified. ?
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
## 2021-09-28 NOTE — ED Triage Notes (Signed)
Pt brought in by friends for suicidal thoughts, ETOH abuse, and a chin laceration. Pt states she is "just tired," but confirms suicidal ideation. Friends reports concern for her safety. Pt has an actively bleeding laceration to chin. States she fell off her porch. Pt alert, but appears intoxicated. She confirms ETOH use today. ?

## 2021-09-28 NOTE — ED Notes (Signed)
Pt ambulated to the restroom with the assistance of a friend. Pt unable to stand without at least one person for assistance due to ETOH according to friends. This RN cleaned blood from pts chin and gave her new gauze to hold pressure to chin. Assisted pt back to vertical treatment area.  ?

## 2021-09-29 MED ORDER — ACETAMINOPHEN 325 MG PO TABS
650.0000 mg | ORAL_TABLET | Freq: Once | ORAL | Status: AC
Start: 2021-09-29 — End: 2021-09-29
  Administered 2021-09-29: 650 mg via ORAL
  Filled 2021-09-29: qty 2

## 2021-09-29 MED ORDER — ACETAMINOPHEN 325 MG PO TABS
650.0000 mg | ORAL_TABLET | Freq: Once | ORAL | Status: AC
  Administered 2021-09-29: 650 mg via ORAL
  Filled 2021-09-29: qty 2

## 2021-09-29 MED ORDER — ACETAMINOPHEN 500 MG PO TABS
500.0000 mg | ORAL_TABLET | Freq: Once | ORAL | Status: AC
Start: 2021-09-29 — End: 2021-09-29
  Administered 2021-09-29: 500 mg via ORAL
  Filled 2021-09-29: qty 1

## 2021-09-29 NOTE — [facility] Counselor (Signed)
TTS attempted to contact patient's nurse at 10:09am to conduct a tele-psych. No response TTS will attempt see patient later this date.  ?

## 2021-09-29 NOTE — ED Notes (Signed)
Patient alert and awake and speaking on the phone with boyfriend ?

## 2021-09-29 NOTE — ED Notes (Signed)
TTS in progress 

## 2021-09-29 NOTE — ED Notes (Signed)
Date and time results received: 09/29/21 2130 ?(use smartphrase ".now" to insert current time) ? ?Test: ETOH ?Critical Value: 321 ? ?Name of Provider Notified: J. Idol, PA-C ? ?

## 2021-09-29 NOTE — ED Notes (Signed)
D/C instructions reviewed with patient. 1 patient belonging bag returned to patient. All questions/concerns answered and pt verbalized understanding. ?

## 2021-09-29 NOTE — ED Notes (Signed)
Pt boyfriend at bedside.

## 2021-09-29 NOTE — BH Assessment (Signed)
Comprehensive Clinical Assessment (CCA) Note ? ?09/29/2021 ?Jacinta ShoeKourtney D Louissaint ?[id number] ? ?DISPOSITION: Case was staffed with Cliffton AstersWhite NP who has psychiatrically cleared patient for discharge. To note patient had a IVC initiated on arrival and needs to be rescinded this date prior to discharge.   ? ?Flowsheet Row ED [facility]  ?C-SSRS RISK CATEGORY No Risk No Risk  ? ?  ?The patient demonstrates the following risk factors for suicide: Chronic risk factors for suicide include: N/A. Acute risk factors for suicide include: N/A. Protective factors for this patient include: coping skills. Considering these factors, the overall suicide risk at this point appears to be low. Patient is appropriate for outpatient follow up.   ? ?Patient is a 31 year old female who was seen and assessed at 1800 hours this date. Patient presented to [facility] impaired on arrival with a BAL of 321 (09/28/21 2130 patient's BAL was a noted 321).Patient per notes endorsed S/I at that time although denies at the time of assessment. Patient denies any H/I or AVH.  ? ?BELOW NOTE WRITTEN BY PA AT 2301 ?Idol PA writes on 5/12 at 2301: Jacinta ShoeKourtney D Reffitt is a 31 y.o. female with a history significant for depression who endorses she is currently drinking alcohol almost daily presenting for evaluation of chin laceration, intoxication and suicidal ideation.  She was brought in by friends this evening who endorses that she has been having suicidal thoughts which patient confirms initially upon presentation.  She is very tearful upon presentation.  She was drinking alcohol 4 hours prior to arrival and appears quite intoxicated.  She reports tripping off of her front porch and struck her chin on an unknown object, laceration is present, denies any other complaints of pain.  Per patient's chart she is current with her tetanus, last tetanus was received April 2018 ? ?Patient  this date 5/13 1800 HOURS at the time of assessment is as stated above, denying any S/I. When questioned in reference to statements that she made on arrival in reference to S/I patient states she "really doesn't remember saying that" admitting that she had consumed a large amount of alcohol prior to arrival. Patient denies any other SA issues with UDS still pending at the time of assessment. Patient has an history significant for alcohol abuse and depression per chart review. Patient was seen in 2015 when she presented to [facility] with similar symptoms reporting S/I and Depression at that time also although she denied S/I at the time of consultation. Patient was held overnight at that time and discharged the next day not meeting inpatient criteria. [facility]. When questioned this date in reference to that incident patient stated she was "under a lot of stress at the time being on parole and just "didn't have it together." Patient stated she briefly attending counseling feeling the depression self resolved although this date she admits to having "ups and downs" still. Patient denies any current OP services, counseling or medication interventions for any mental health issues. Patient reports she gets very emotional when she consumes alcohol stating she consumes various amounts of liquor three to four times a week with last use prior to arrival when patient reported she "had too much to drink." Patient is vague this date in reference to amounts consumed, time frame or use patterns. Patient denies access to firearms or history of self injury. Patient denies any  previous attempts at suicide. Patient denies any current withdrawals and presents with a pleasant affect. When questioned in reference to whether she feels she needs assistance with her ongoing alcohol use or depression patient states she would prefer to "do it on her own." Patient is currently contracting for  safety and is requesting to be discharged. Patient gives consent for this writer to speak to her mother Garlan Fair [phone]. ? ?COLLATERAL: Mother Garlan Fair states she has no safety concerns if reference to patient being discharged this date. Lorenda Hatchet reports that she is patient's primary support and "will always be there for her." Mother reports to her knowledge patient has never attempted to actually harm herself.     ? ?Patient presents as alert and oriented x 5. Patient's speaks in a clear voice with normal volume. Patient's mood is pleasant with affect congruent. Patient's memory is intact with thoughts organized. Patient does not appear to be responding to internal stimuli.   ? ? ?Chief Complaint:  ?Chief Complaint  ?Patient presents with  ? V70.1  ? Suicidal  ? ?Visit Diagnosis: Alcohol abuse  ? ? ?CCA Screening, Triage and Referral (STR) ? ?Patient Reported Information ?How did you hear about [place]? Self ? ?What Is the Reason for Your Visit/Call Today? Pt presented last night impaired and made S/I statements on arrival. Pt currently denies ? ?How Long Has This Been Causing You Problems? <Week ? ?What Do You Feel Would Help You the Most Today? -- (Pt declines assistance states she can "get it on her own if she feels she needs it.") ? ? ?Have You Recently Had Any Thoughts About Hurting Yourself? No ? ?Are You Planning to Commit Suicide/Harm Yourself At This time? No ? ? ?Have you Recently Had Thoughts About Hurting Someone Karolee Ohs? No ? ?Are You Planning to Harm Someone at This Time? No ? ?Explanation: No data recorded ? ?Have You Used Any Alcohol or Drugs in the Past 24 Hours? Yes ? ?How Long Ago Did You Use Drugs or Alcohol? No data recorded ?What Did You Use and How Much? Pt reports drinking an unknown amount of alcohol prior to arrival ? ? ?Do You Currently Have a Therapist/Psychiatrist? No ? ?Name of Therapist/Psychiatrist: No data recorded ? ?Have You Been Recently Discharged From Any Office Practice or  Programs? No ? ?Explanation of Discharge From Practice/Program: No data recorded ? ?  ?CCA Screening Triage Referral Assessment ?Type of Contact: Tele-Assessment ? ?Telemedicine Service Delivery: Telemedicine service delivery: This service was provided via telemedicine using a 2-way, interactive audio and video technology ? ?Is this Initial or Reassessment? Initial Assessment ? ?Date Telepsych consult ordered in [facility]:  09/29/21 ? ?Time Telepsych consult ordered in [facility]:  No data recorded ?Location of Assessment: [facility] ? ?Provider Location: [facility] ? ? ?Collateral Involvement: Contacting patient's mother Garlan Fair ? ? ?Does Patient Have a Automotive engineer Guardian? No data recorded ?Name and Contact of Legal Guardian: No data recorded ?If Minor and Not Living with Parent(s), Who has Custody? NA ? ?Is CPS involved or ever been involved? Never ? ?Is APS involved or ever been involved? Never ? ? ?Patient Determined To Be At Risk for Harm To Self or Others Based on Review of Patient Reported Information or Presenting Complaint? No ? ?Method: No data recorded ?Availability of Means: No data recorded ?Intent: No data recorded ?Notification Required: No data recorded ?Additional Information for Danger to Others Potential: No data recorded ?Additional Comments for Danger to Others  Potential: No data recorded ?Are There Guns or Other Weapons in Your Home? No data recorded ?Types of Guns/Weapons: No data recorded ?Are These Weapons Safely Secured?                            No data recorded ?Who Could Verify You Are Able To Have These Secured: No data recorded ?Do You Have any Outstanding Charges, Pending Court Dates, Parole/Probation? No data recorded ?Contacted To Inform of Risk of Harm To Self or Others: Other: Comment (NA) ? ? ? ?Does Patient Present under Involuntary Commitment? No ? ?IVC Papers Initial File Date: No data recorded ? ?[place] of Residence: [place] ? ? ?Patient Currently Receiving  the Following Services: Not Receiving Services ? ? ?Determination of Need: Routine (7 days) ? ? ?Options For Referral: Outpatient Therapy ? ? ? ? ?CCA Biopsychosocial ?Patient Reported Schizophrenia/Schi

## 2021-09-29 NOTE — ED Notes (Signed)
Pt breakfast at bedside 

## 2021-09-29 NOTE — ED Notes (Signed)
Boyfriend is allowed to call pt per pt. Alexandria Bradshaw ?

## 2021-09-29 NOTE — Discharge Instructions (Addendum)
Have the sutures removed in about 5 days ?

## 2021-09-29 NOTE — ED Provider Notes (Signed)
?  Physical Exam  ?BP 127/81   Pulse 76   Temp 98.1 ?F (36.7 ?C)   Resp 16   Ht 5\' 1"  (1.549 m)   Wt 74.8 kg   LMP 08/27/2021 (Approximate)   SpO2 100%   BMI 31.18 kg/m?  ? ?Physical Exam ? ?Procedures  ?Procedures ? ?ED Course / MDM  ?  ?Medical Decision Making ?Amount and/or Complexity of Data Reviewed ?Labs: ordered. ? ?Risk ?OTC drugs. ?Prescription drug management. ? ? ?Patient's been seen by psychiatry and cleared for discharge.  Sutures can be removed in about 5 days some follow-up resources given ? ? ? ? ?  ?10/27/2021, MD ?09/29/21 2001 ? ?

## 2021-09-29 NOTE — ED Provider Notes (Signed)
Emergency Medicine Observation Re-evaluation Note ? ?Alexandria Bradshaw is a 31 y.o. female, seen on rounds today.  Pt initially presented to the ED for complaints of V70.1 and Suicidal ?Currently, the patient is resting calmly in the ED. ? ?Physical Exam  ?BP 130/76 (BP Location: Left Arm)   Pulse 77   Temp 98.4 ?F (36.9 ?C)   Resp 20   Ht 1.549 m (5\' 1" )   Wt 74.8 kg   LMP 08/27/2021 (Approximate)   SpO2 99%   BMI 31.18 kg/m?  ?Physical Exam ?General: Alert, no distress ?Cardiac: Regular rate ?Lungs: Breathing easily ?Psych: Calm, cooperative ? ?ED Course / MDM  ?EKG:  ? ?I have reviewed the labs performed to date as well as medications administered while in observation.  Recent changes in the last 24 hours include patient's initial evaluation.  She was significantly intoxicated.  We are still waiting for psychiatry evaluation.  No signs of alcohol withdrawal at this time ? ?Plan  ?Current plan is for psychiatry evaluation. ?Alexandria Bradshaw is under involuntary commitment. ?  ? ?  ?10/27/2021, MD ?09/29/21 [phone] ? ?

## 2021-10-02 ENCOUNTER — Telehealth: Payer: Self-pay | Admitting: Orthopedic Surgery

## 2021-10-02 NOTE — Telephone Encounter (Signed)
I called patient per Dr Mort Sawyers message following patient seen at [facility] Emergency room to offer appointment week of 10/07/21; left message with contact person, Jacki Cones; said will ask patient to call back. ?

## 2021-10-02 NOTE — Telephone Encounter (Signed)
-----   Message from Vickki Hearing, MD sent at 09/23/2021  5:25 PM EDT ----- ?No fractures here appt can be week of the 21st  ? ?

## 2021-10-05 ENCOUNTER — Encounter: Payer: Self-pay | Admitting: Orthopedic Surgery

## 2021-10-17 ENCOUNTER — Encounter (HOSPITAL_COMMUNITY): Payer: Self-pay | Admitting: Emergency Medicine

## 2021-10-17 ENCOUNTER — Emergency Department (HOSPITAL_COMMUNITY)
Admission: EM | Admit: 2021-10-17 | Discharge: 2021-10-17 | Disposition: A | Discharge status: Home / Self Care | Payer: Self-pay | Attending: Emergency Medicine | Admitting: Emergency Medicine

## 2021-10-17 ENCOUNTER — Other Ambulatory Visit: Payer: Self-pay

## 2021-10-17 DIAGNOSIS — Z4802 Encounter for removal of sutures: Secondary | ICD-10-CM | POA: Insufficient documentation

## 2021-10-17 DIAGNOSIS — Z5189 Encounter for other specified aftercare: Secondary | ICD-10-CM

## 2021-10-17 DIAGNOSIS — Z4800 Encounter for change or removal of nonsurgical wound dressing: Secondary | ICD-10-CM | POA: Insufficient documentation

## 2021-10-17 NOTE — ED Triage Notes (Signed)
Pt to the ED for suture removal of her chin.

## 2021-10-17 NOTE — Discharge Instructions (Signed)
Please keep the area clean, I recommend keeping the wound out of direct sunlight for next 6 months and apply sunscreen to help prevent scarring  Please follow-up PCP as needed  Come back to the emergency department if you develop chest pain, shortness of breath, severe abdominal pain, uncontrolled nausea, vomiting, diarrhea.

## 2021-10-17 NOTE — ED Provider Notes (Signed)
[facility] EMERGENCY DEPARTMENT Provider Note   CSN: [id number] Arrival date & time: 10/17/21  1042     History  Chief Complaint  Patient presents with   Suture / Staple Removal    Alexandria Bradshaw is a 31 y.o. female.  HPI  Patient with medical history including herpes, depression, presents  with complaints of suture removal.  She endorses that she was seen in the ED on the 12th after  sustained a fall,  states she got sutures and was told to come back in for them to be removed.  She states that she is having no complaints with her sutures, denies any drainage discharge increased redness or worsening pain.  No systemic infection fevers or chills.  She has no other complaints.  She does endorse that she did lose 2 of her sutures.  Home Medications Prior to Admission medications   Medication Sig Start Date End Date Taking? Authorizing Provider  acetaminophen (TYLENOL) 325 MG tablet Take 650 mg by mouth every 6 (six) hours as needed.    [provider]  Aspirin-Acetaminophen-Caffeine (GOODYS EXTRA STRENGTH PO) Take 1 Package by mouth daily as needed (pain).    [provider]      Allergies    No known allergies    Review of Systems   Review of Systems  Constitutional:  Negative for chills and fever.  Respiratory:  Negative for shortness of breath.   Cardiovascular:  Negative for chest pain.  Gastrointestinal:  Negative for abdominal pain.  Neurological:  Negative for headaches.   Physical Exam Updated Vital Signs BP (!) 153/98 (BP Location: Right Arm)   Pulse 82   Temp 97.8 F (36.6 C) (Oral)   Resp 17   Ht 5\' 1"  (1.549 m)   Wt 74.8 kg   LMP 09/24/2021 (Approximate)   SpO2 100%   BMI 31.18 kg/m  Physical Exam Vitals and nursing note reviewed.  Constitutional:      General: She is not in acute distress.    Appearance: Normal appearance. She is not ill-appearing or diaphoretic.  HENT:     Head: Normocephalic and atraumatic.     Nose: No  congestion or rhinorrhea.  Eyes:     Conjunctiva/sclera: Conjunctivae normal.  Cardiovascular:     Rate and Rhythm: Normal rate and regular rhythm.  Pulmonary:     Effort: Pulmonary effort is normal.  Musculoskeletal:     Cervical back: Neck supple.     Right lower leg: No edema.     Left lower leg: No edema.  Skin:    General: Skin is warm and dry.     Coloration: Skin is not jaundiced or pale.     Comments: Has sutures placed underneath the chin, no evidence of infection present no drainage or discharge or overlying skin changes wound appears to be healing well.  Neurological:     Mental Status: She is alert and oriented to person, place, and time.  Psychiatric:        Mood and Affect: Mood normal.    ED Results / Procedures / Treatments   Labs (all labs ordered are listed, but only abnormal results are displayed) Labs Reviewed - No data to display  EKG None  Radiology No results found.  Procedures .Suture Removal  Date/Time: 10/17/2021 12:34 PM Performed by: 10/19/2021, PA-C Authorized by: Carroll Sage, PA-C   Consent:    Consent obtained:  Verbal   Consent given by:  Patient  Risks, benefits, and alternatives were discussed: yes     Risks discussed:  Bleeding, pain and wound separation   Alternatives discussed:  No treatment, delayed treatment, alternative treatment, observation and referral Universal protocol:    Patient identity confirmed:  Verbally with patient Location:    Location:  Head/neck   Head/neck location:  Chin Procedure details:    Wound appearance:  No signs of infection, good wound healing and clean   Number of sutures removed:  4   Number of staples removed:  0 Post-procedure details:    Post-removal:  No dressing applied   Procedure completion:  Tolerated well, no immediate complications    Medications Ordered in ED Medications - No data to display  ED Course/ Medical Decision Making/ A&P                            Medical Decision Making  This patient presents to the ED for concern of suture removal, this involves an extensive number of treatment options, and is a complaint that carries with it a high risk of complications and morbidity.  The differential diagnosis includes infection, abscess, retained foreign body    Additional history obtained:  Additional history obtained from N/A External records from outside source obtained and reviewed including previous ER note   Co morbidities that complicate the patient evaluation  N/A  Social Determinants of Health:  No primary care provider    Lab Tests:  I Ordered, and personally interpreted labs.  The pertinent results include: N/A   Imaging Studies ordered:  I ordered imaging studies including N/A I independently visualized and interpreted imaging which showed N/A I agree with the radiologist interpretation   Cardiac Monitoring:  The patient was maintained on a cardiac monitor.  I personally viewed and interpreted the cardiac monitored which showed an underlying rhythm of: N/A   Medicines ordered and prescription drug management:  I ordered medication including N/A I have reviewed the patients home medicines and have made adjustments as needed  Critical Interventions:  N/A   Reevaluation:  Presents with suture removal, I remove the sutures out difficulty.  And she is ready for discharge.  Consultations Obtained:  N/a    Test Considered:  N/a    Rule out Low suspicion for infection no evidence of infection present my exam.  Low suspicion for retained foreign body as area is soft nontender no tenderness on my exam no palpable nodules or defects noted.  4 sutures were removed but patient states 2 of them fell on the right on I doubt there is any foreign bodies retained.    Dispostion and problem list  After consideration of the diagnostic results and the patients response to treatment, I feel that the patent  would benefit from discharge.  Wound check-wound is healing well, will obtain basic wound management follow-up PCP as needed Suture removal            Final Clinical Impression(s) / ED Diagnoses Final diagnoses:  Visit for suture removal  Visit for wound check    Rx / DC Orders ED Discharge Orders     None         Carroll Sage, PA-C 10/17/21 1239    Gloris Manchester, MD 10/22/21 0800

## 2022-02-25 ENCOUNTER — Encounter (HOSPITAL_COMMUNITY): Payer: Self-pay | Admitting: Emergency Medicine

## 2022-02-25 ENCOUNTER — Emergency Department (HOSPITAL_COMMUNITY)
Admission: EM | Admit: 2022-02-25 | Discharge: 2022-02-25 | Disposition: A | Discharge status: Home / Self Care | Payer: Self-pay | Attending: Emergency Medicine | Admitting: Emergency Medicine

## 2022-02-25 ENCOUNTER — Other Ambulatory Visit: Payer: Self-pay

## 2022-02-25 ENCOUNTER — Emergency Department (HOSPITAL_COMMUNITY): Payer: Self-pay

## 2022-02-25 DIAGNOSIS — R0789 Other chest pain: Secondary | ICD-10-CM | POA: Insufficient documentation

## 2022-02-25 DIAGNOSIS — Z7982 Long term (current) use of aspirin: Secondary | ICD-10-CM | POA: Insufficient documentation

## 2022-02-25 DIAGNOSIS — R002 Palpitations: Secondary | ICD-10-CM | POA: Insufficient documentation

## 2022-02-25 DIAGNOSIS — R5383 Other fatigue: Secondary | ICD-10-CM | POA: Insufficient documentation

## 2022-02-25 DIAGNOSIS — R0602 Shortness of breath: Secondary | ICD-10-CM | POA: Insufficient documentation

## 2022-02-25 DIAGNOSIS — I10 Essential (primary) hypertension: Secondary | ICD-10-CM | POA: Insufficient documentation

## 2022-02-25 DIAGNOSIS — R000 Tachycardia, unspecified: Secondary | ICD-10-CM | POA: Insufficient documentation

## 2022-02-25 DIAGNOSIS — Z20822 Contact with and (suspected) exposure to covid-19: Secondary | ICD-10-CM | POA: Insufficient documentation

## 2022-02-25 LAB — CBC WITH DIFFERENTIAL/PLATELET
Abs Immature Granulocytes: 0.02 10*3/uL (ref 0.00–0.07)
Basophils Absolute: 0 10*3/uL (ref 0.0–0.1)
Basophils Relative: 0 %
Eosinophils Absolute: 0.1 10*3/uL (ref 0.0–0.5)
Eosinophils Relative: 1 %
HCT: 41.9 % (ref 36.0–46.0)
Hemoglobin: 14.1 g/dL (ref 12.0–15.0)
Immature Granulocytes: 0 %
Lymphocytes Relative: 52 %
Lymphs Abs: 5.6 10*3/uL — ABNORMAL HIGH (ref 0.7–4.0)
MCH: 33.3 pg (ref 26.0–34.0)
MCHC: 33.7 g/dL (ref 30.0–36.0)
MCV: 98.8 fL (ref 80.0–100.0)
Monocytes Absolute: 0.4 10*3/uL (ref 0.1–1.0)
Monocytes Relative: 4 %
Neutro Abs: 4.6 10*3/uL (ref 1.7–7.7)
Neutrophils Relative %: 43 %
Platelets: 330 10*3/uL (ref 150–400)
RBC: 4.24 MIL/uL (ref 3.87–5.11)
RDW: 14.6 % (ref 11.5–15.5)
WBC: 10.7 10*3/uL — ABNORMAL HIGH (ref 4.0–10.5)
nRBC: 0 % (ref 0.0–0.2)

## 2022-02-25 LAB — RESP PANEL BY RT-PCR (FLU A&B, COVID) ARPGX2
Influenza A by PCR: NEGATIVE
Influenza B by PCR: NEGATIVE
SARS Coronavirus 2 by RT PCR: NEGATIVE

## 2022-02-25 LAB — BASIC METABOLIC PANEL
Anion gap: 11 (ref 5–15)
BUN: 12 mg/dL (ref 6–20)
CO2: 27 mmol/L (ref 22–32)
Calcium: 9.9 mg/dL (ref 8.9–10.3)
Chloride: 105 mmol/L (ref 98–111)
Creatinine, Ser: 0.73 mg/dL (ref 0.44–1.00)
GFR, Estimated: >60 mL/min (ref >60)
Glucose, Bld: 94 mg/dL (ref 70–99)
Potassium: 3.9 mmol/L (ref 3.5–5.1)
Sodium: 143 mmol/L (ref 135–145)

## 2022-02-25 LAB — D-DIMER, QUANTITATIVE: D-Dimer, Quant: <0.27 ug/mL-FEU (ref 0.00–0.50)

## 2022-02-25 LAB — PREGNANCY, URINE: Preg Test, Ur: NEGATIVE

## 2022-02-25 NOTE — ED Notes (Signed)
ED Provider at bedside. 

## 2022-02-25 NOTE — ED Triage Notes (Signed)
Pt presents with SOB, per pt she was having chest pain last night and today she's having a little SOB and yawning a lot.

## 2022-02-25 NOTE — ED Notes (Signed)
EDP made aware of pt's bp 142/103

## 2022-02-25 NOTE — Discharge Instructions (Signed)
Your work-up this evening was reassuring.  You may be experiencing palpitations.  Someone from the cardiology office should be contacting you to arrange a follow-up appointment.  Please try to avoid caffeine.  Please follow-up with your primary care provider regarding your elevated blood pressure.  Return to the emergency department for any new or worsening symptoms.

## 2022-03-01 NOTE — ED Provider Notes (Signed)
[facility] EMERGENCY DEPARTMENT Provider Note   CSN: [id number] Arrival date & time: 02/25/22  1627     History  Chief Complaint  Patient presents with   Shortness of Breath    Alexandria Bradshaw is a 31 y.o. female.   Shortness of Breath Associated symptoms: chest pain   Associated symptoms: no abdominal pain, no cough, no fever, no headaches, no rash and no vomiting         Alexandria Bradshaw is a 31 y.o. female who presents to the Emergency Department complaining of  shortness of breath, chest tightness and excessive fatigue and yawning.  She began having chest tightness on the evening prior to arrival.  She describes  tightness in her chest when she yawns and feeling tired.  Believes that she feels her heart is racing at times.  Endorses caffeine intake.  States she is yawning a lot and unsure why.  Believes she may be tired.  She denies recent illness, medication changes, or anemia.   No hx of PE or cardiac issues.    Home Medications Prior to Admission medications   Medication Sig Start Date End Date Taking? Authorizing Provider  acetaminophen (TYLENOL) 325 MG tablet Take 650 mg by mouth every 6 (six) hours as needed.    [provider]  Aspirin-Acetaminophen-Caffeine (GOODYS EXTRA STRENGTH PO) Take 1 Package by mouth daily as needed (pain).    [provider]      Allergies    No known allergies    Review of Systems   Review of Systems  Constitutional:  Positive for fatigue. Negative for appetite change and fever.  HENT:  Negative for congestion and trouble swallowing.   Respiratory:  Positive for chest tightness and shortness of breath. Negative for cough.   Cardiovascular:  Positive for chest pain. Negative for palpitations.  Gastrointestinal:  Negative for abdominal pain, nausea and vomiting.  Genitourinary:  Negative for dysuria.  Musculoskeletal:  Negative for arthralgias and myalgias.  Skin:  Negative for rash.  Neurological:  Negative  for dizziness, syncope, weakness, light-headedness, numbness and headaches.  Psychiatric/Behavioral:  Negative for confusion.     Physical Exam Updated Vital Signs BP (!) 142/103   Pulse 72   Temp 97.7 F (36.5 C) (Oral)   Resp 14   Ht 5\' 1"  (1.549 m)   Wt 74.8 kg   LMP 01/11/2022 (Approximate)   SpO2 100%   BMI 31.18 kg/m  Physical Exam Vitals and nursing note reviewed.  Constitutional:      General: She is not in acute distress.    Appearance: She is well-developed. She is not ill-appearing or toxic-appearing.  HENT:     Head: Atraumatic.     Mouth/Throat:     Mouth: Mucous membranes are moist.  Eyes:     Conjunctiva/sclera: Conjunctivae normal.     Pupils: Pupils are equal, round, and reactive to light.  Cardiovascular:     Rate and Rhythm: Normal rate and regular rhythm.     Pulses: Normal pulses.  Pulmonary:     Effort: Pulmonary effort is normal.     Breath sounds: Normal breath sounds.  Chest:     Chest wall: No tenderness.  Abdominal:     Palpations: Abdomen is soft.     Tenderness: There is no abdominal tenderness.  Musculoskeletal:     Cervical back: Normal range of motion.     Right lower leg: No edema.     Left lower leg: No edema.  Skin:    General: Skin is warm.     Capillary Refill: Capillary refill takes less than 2 seconds.     Findings: No rash.  Neurological:     General: No focal deficit present.     Mental Status: She is alert.     Sensory: No sensory deficit.     Motor: No weakness.     ED Results / Procedures / Treatments   Labs (all labs ordered are listed, but only abnormal results are displayed) Labs Reviewed  CBC WITH DIFFERENTIAL/PLATELET - Abnormal; Notable for the following components:      Result Value   WBC 10.7 (*)    Lymphs Abs 5.6 (*)    All other components within normal limits  RESP PANEL BY RT-PCR (FLU A&B, COVID) ARPGX2  BASIC METABOLIC PANEL  D-DIMER, QUANTITATIVE  PREGNANCY, URINE     EKG None  Radiology No results found.  Procedures Procedures    Medications Ordered in ED Medications - No data to display  ED Course/ Medical Decision Making/ A&P                           Medical Decision Making Pt here for eval of chest pain, SOB and excessive yawning and fatigue.  No hx of PE/DVT.  No anemia.    On exam, well appearing mildly hypertensive.  PERC negative.  No tachycardia on my exam  Diff dx includes but not ;limited to PE, ACS, thyroid issue, anemia.    Amount and/or Complexity of Data Reviewed Labs: ordered.    Details: Labs intrpreted by me and unremarkable.  Neg D dimer, covid and flu test also negative.   Radiology: ordered.    Details: CXR w/o acute CP process ECG/medicine tests: ordered.    Details: EKG shows sinus tachycardia Discussion of management or test interpretation with external provider(s): Pt work up here reassuring.  She is well appearing and cause of her sx's unclear.  Chest tightness is associated with yawning and I find no clinical evidence of acute process.  She describes having racing of her heart.  Possibly related to palpatitations.  No hx of syncope or dizziness.  I feel that she is approp for d/c home and I will provide ambulatory referral to cards for further eval of possible palpitations.  Strict return precautions discussed.  Pt also advised to refrain from caffeine intake           Final Clinical Impression(s) / ED Diagnoses Final diagnoses:  Heart palpitations  Palpitations    Rx / DC Orders ED Discharge Orders          Ordered    Ambulatory referral to Cardiology        02/25/22 2249              Pauline Aus, PA-C 03/01/22 1657    Bethann Berkshire, MD 03/05/22 [phone]

## 2022-07-18 ENCOUNTER — Encounter: Payer: Self-pay | Admitting: Radiology

## 2023-01-15 ENCOUNTER — Other Ambulatory Visit: Payer: Self-pay

## 2023-01-15 ENCOUNTER — Emergency Department (HOSPITAL_COMMUNITY)
Admission: EM | Admit: 2023-01-15 | Discharge: 2023-01-15 | Disposition: A | Discharge status: Home / Self Care | Payer: Self-pay | Attending: Emergency Medicine | Admitting: Emergency Medicine

## 2023-01-15 ENCOUNTER — Encounter (HOSPITAL_COMMUNITY): Payer: Self-pay | Admitting: Emergency Medicine

## 2023-01-15 DIAGNOSIS — N760 Acute vaginitis: Secondary | ICD-10-CM | POA: Insufficient documentation

## 2023-01-15 DIAGNOSIS — A5901 Trichomonal vulvovaginitis: Secondary | ICD-10-CM | POA: Insufficient documentation

## 2023-01-15 DIAGNOSIS — N920 Excessive and frequent menstruation with regular cycle: Secondary | ICD-10-CM

## 2023-01-15 DIAGNOSIS — N939 Abnormal uterine and vaginal bleeding, unspecified: Secondary | ICD-10-CM

## 2023-01-15 DIAGNOSIS — B9689 Other specified bacterial agents as the cause of diseases classified elsewhere: Secondary | ICD-10-CM | POA: Insufficient documentation

## 2023-01-15 DIAGNOSIS — I10 Essential (primary) hypertension: Secondary | ICD-10-CM | POA: Insufficient documentation

## 2023-01-15 LAB — CBC WITH DIFFERENTIAL/PLATELET
Abs Immature Granulocytes: 0.02 10*3/uL (ref 0.00–0.07)
Basophils Absolute: 0 10*3/uL (ref 0.0–0.1)
Basophils Relative: 0 %
Eosinophils Absolute: 0 10*3/uL (ref 0.0–0.5)
Eosinophils Relative: 0 %
HCT: 36.8 % (ref 36.0–46.0)
Hemoglobin: 12.2 g/dL (ref 12.0–15.0)
Immature Granulocytes: 0 %
Lymphocytes Relative: 41 %
Lymphs Abs: 4.7 10*3/uL — ABNORMAL HIGH (ref 0.7–4.0)
MCH: 32.4 pg (ref 26.0–34.0)
MCHC: 33.2 g/dL (ref 30.0–36.0)
MCV: 97.6 fL (ref 80.0–100.0)
Monocytes Absolute: 0.5 10*3/uL (ref 0.1–1.0)
Monocytes Relative: 4 %
Neutro Abs: 6.2 10*3/uL (ref 1.7–7.7)
Neutrophils Relative %: 55 %
Platelets: 296 10*3/uL (ref 150–400)
RBC: 3.77 MIL/uL — ABNORMAL LOW (ref 3.87–5.11)
RDW: 13.9 % (ref 11.5–15.5)
WBC: 11.5 10*3/uL — ABNORMAL HIGH (ref 4.0–10.5)
nRBC: 0 % (ref 0.0–0.2)

## 2023-01-15 LAB — BASIC METABOLIC PANEL
Anion gap: 13 (ref 5–15)
BUN: 8 mg/dL (ref 6–20)
CO2: 24 mmol/L (ref 22–32)
Calcium: 9.5 mg/dL (ref 8.9–10.3)
Chloride: 101 mmol/L (ref 98–111)
Creatinine, Ser: 0.68 mg/dL (ref 0.44–1.00)
GFR, Estimated: >60 mL/min (ref >60)
Glucose, Bld: 94 mg/dL (ref 70–99)
Potassium: 3.8 mmol/L (ref 3.5–5.1)
Sodium: 138 mmol/L (ref 135–145)

## 2023-01-15 LAB — WET PREP, GENITAL
Sperm: NONE SEEN
WBC, Wet Prep HPF POC: <10 (ref <10)
Yeast Wet Prep HPF POC: NONE SEEN

## 2023-01-15 LAB — URINALYSIS, ROUTINE W REFLEX MICROSCOPIC
Bilirubin Urine: NEGATIVE
Glucose, UA: NEGATIVE mg/dL
Ketones, ur: NEGATIVE mg/dL
Leukocytes,Ua: NEGATIVE
Nitrite: NEGATIVE
Protein, ur: 100 mg/dL — AB
Specific Gravity, Urine: 1.005 (ref 1.005–1.030)
pH: 5 (ref 5.0–8.0)

## 2023-01-15 LAB — PREGNANCY, URINE: Preg Test, Ur: NEGATIVE

## 2023-01-15 MED ORDER — MEGESTROL ACETATE 40 MG PO TABS
40.0000 mg | ORAL_TABLET | Freq: Two times a day (BID) | ORAL | 0 refills | Status: AC
Start: 2023-01-15 — End: ?

## 2023-01-15 MED ORDER — CEFTRIAXONE SODIUM 500 MG IJ SOLR: 500.0000 mg | Freq: Once | INTRAMUSCULAR | Status: DC

## 2023-01-15 MED ORDER — METRONIDAZOLE 500 MG PO TABS: 500.0000 mg | ORAL_TABLET | Freq: Two times a day (BID) | ORAL | 0 refills | Status: AC

## 2023-01-15 MED ORDER — DOXYCYCLINE HYCLATE 100 MG PO CAPS: 100.0000 mg | ORAL_CAPSULE | Freq: Two times a day (BID) | ORAL | 0 refills | Status: AC

## 2023-01-15 NOTE — Discharge Instructions (Addendum)
Is a pleasure taking care of you today.  You are seen for vaginal bleeding.  Your blood count was normal and you are not pregnant.  We did a wet prep and did note that you have trichomonas so prescribing you antibiotics.  You are also treating you for gonorrhea and chlamydia although your test results are not back yet.  I am prescribing Megace which is a medication which should help your bleeding stopped.  Take it twice a day until your bleeding stops.  [facility]up [facility] in 4 weeks.  Call in the next couple of days to schedule your appointment.

## 2023-01-15 NOTE — ED Provider Notes (Signed)
[place] EMERGENCY DEPARTMENT AT [facility] Provider Note   CSN: [id number] Arrival date & time: 01/15/23  1431     History  Chief Complaint  Patient presents with   Vaginal Bleeding    Alexandria Bradshaw is a 32 y.o. female.  Reports history of hypertension, she is G2 P1 with 1 abortion. She is here today with vaginal bleeding x 2 weeks.  Denies any pain with this.  States that has been heavy, feels up a pad or tampon a couple of hours and is in fact wearing an adult diaper today due to leakage.  Bleeding seems to have slowed since being in the ER however.  No nausea or vomiting.  She states last period was in June.  She states she had period in April and May and then had heavy bleeding with clots for couple of days in June and thinks she had a miscarriage.  Did not have another period until 2 weeks ago and started bleeding and has been continuous every day since then.  Dizziness or weakness.  She has not seen a gynecologist.  Vaginal Bleeding      Home Medications Prior to Admission medications   Medication Sig Start Date End Date Taking? Authorizing Provider  doxycycline (VIBRAMYCIN) 100 MG capsule Take 1 capsule (100 mg total) by mouth 2 (two) times daily for 7 days. 01/15/23 01/22/23 Yes Harpreet Signore A, PA-C  megestrol (MEGACE) 40 MG tablet Take 1 tablet (40 mg total) by mouth 2 (two) times daily. Take 40 mg by mouth twice daily until bleeding stops 01/15/23  Yes Lorianne Malbrough A, PA-C  metroNIDAZOLE (FLAGYL) 500 MG tablet Take 1 tablet (500 mg total) by mouth 2 (two) times daily. 01/15/23  Yes Zala Degrasse A, PA-C  acetaminophen (TYLENOL) 325 MG tablet Take 650 mg by mouth every 6 (six) hours as needed.    [provider]  Aspirin-Acetaminophen-Caffeine (GOODYS EXTRA STRENGTH PO) Take 1 Package by mouth daily as needed (pain).    [provider]      Allergies    No known allergies    Review of Systems   Review of Systems  Genitourinary:   Positive for vaginal bleeding.    Physical Exam Updated Vital Signs BP (!) 157/115 (BP Location: Right Arm)   Pulse 72   Temp 98.2 F (36.8 C) (Oral)   Resp 16   Ht 5\' 1"  (1.549 m)   Wt 74.8 kg   SpO2 100%   BMI 31.16 kg/m  Physical Exam Vitals and nursing note reviewed. Exam conducted with a chaperone present.  Constitutional:      General: She is not in acute distress.    Appearance: She is well-developed.  HENT:     Head: Normocephalic and atraumatic.     Mouth/Throat:     Mouth: Mucous membranes are moist.  Eyes:     Conjunctiva/sclera: Conjunctivae normal.  Cardiovascular:     Rate and Rhythm: Normal rate and regular rhythm.     Heart sounds: No murmur heard. Pulmonary:     Effort: Pulmonary effort is normal. No respiratory distress.     Breath sounds: Normal breath sounds.  Abdominal:     Palpations: Abdomen is soft.     Tenderness: There is no abdominal tenderness.  Genitourinary:    General: Normal vulva.     Exam position: Lithotomy position.     Vagina: No signs of injury and foreign body. Bleeding present. No tenderness.     Cervix:  Normal. No friability or erythema.     Uterus: Not enlarged and not tender.      Adnexa:        Right: No mass or tenderness.         Left: No mass or tenderness.       Comments: Slight trickle of blood from the cervical os with slight pooling in the vaginal vault, no heavy bleeding Musculoskeletal:        General: No swelling.     Cervical back: Neck supple.  Skin:    General: Skin is warm and dry.     Capillary Refill: Capillary refill takes less than 2 seconds.  Neurological:     General: No focal deficit present.     Mental Status: She is alert and oriented to person, place, and time.  Psychiatric:        Mood and Affect: Mood normal.     ED Results / Procedures / Treatments   Labs (all labs ordered are listed, but only abnormal results are displayed) Labs Reviewed  WET PREP, GENITAL - Abnormal; Notable for the  following components:      Result Value   Trich, Wet Prep PRESENT (*)    Clue Cells Wet Prep HPF POC PRESENT (*)    All other components within normal limits  URINALYSIS, ROUTINE W REFLEX MICROSCOPIC - Abnormal; Notable for the following components:   Color, Urine STRAW (*)    Hgb urine dipstick LARGE (*)    Protein, ur 100 (*)    Bacteria, UA RARE (*)    All other components within normal limits  CBC WITH DIFFERENTIAL/PLATELET - Abnormal; Notable for the following components:   WBC 11.5 (*)    RBC 3.77 (*)    Lymphs Abs 4.7 (*)    All other components within normal limits  PREGNANCY, URINE  BASIC METABOLIC PANEL  GC/CHLAMYDIA PROBE AMP ([place]) NOT AT [facility]    EKG None  Radiology No results found.  Procedures Procedures    Medications Ordered in ED Medications  cefTRIAXone (ROCEPHIN) injection 500 mg (has no administration in time range)    ED Course/ Medical Decision Making/ A&P                                 Medical Decision Making Ddx: Dysfunctional uterine bleeding, uterine fibroids, spontaneous abortion, cervical mass, vaginal area, other  Course: Patient here for 2 weeks of vaginal bleeding daily described as heavy she is not dizzy or weak, labs were ordered, there is no UTI she is not pregnant and she is not anemic, hemoglobin today is 12.2, BMP is normal.  Pelvic exam shows small amount of bleeding.  There is no uterine or adnexal tenderness, no cervical tenderness.  No cervical masses noted.  Patient does not currently follow with OB/GYN.  I discussed with her we will start her on Megace to help with the bleeding and have her follow-up closely with gynecology.  Regarding the Megace I consulted Dr. Shawnie Pons the on-call OB/GYN.  She recommended Megace 40 mg twice daily until bleeding stops and [facility].  He has no history of VTE advised patient of this.  She was given strict return precautions.   Wet prep shows trichomonas and clue  cells-because of patient, she is going to inform her partner findings as well as ensure he is treated.  She only has 1 female  partner, no other partners recently.  She requested empiric treatment for GC chlamydia as well.     Amount and/or Complexity of Data Reviewed Labs: ordered.           Final Clinical Impression(s) / ED Diagnoses Final diagnoses:  Abnormal uterine bleeding (AUB)  Trichomoniasis of vagina  BV (bacterial vaginosis)    Rx / DC Orders ED Discharge Orders          Ordered    megestrol (MEGACE) 40 MG tablet  2 times daily        01/15/23 1755    metroNIDAZOLE (FLAGYL) 500 MG tablet  2 times daily        01/15/23 1757    doxycycline (VIBRAMYCIN) 100 MG capsule  2 times daily        01/15/23 1759              Josem Kaufmann 01/15/23 Thayer Headings, MD 01/16/23 0010

## 2023-01-15 NOTE — ED Triage Notes (Signed)
Pt reports vaginal bleeding x 14 days. Pt states cycles are irregular and thinks she had a miscarriage in June and cycle has not been the same since. Pt never had a positive preg test. Pt has slight cramp in back but no other pain.

## 2023-01-15 NOTE — Progress Notes (Signed)
Patient ID: Alexandria Bradshaw, female   DOB: 10-13-1990, 32 y.o.   MRN: [id number]   OB/GYN Telephone Consult  01/15/2023   [name] is a 32 y.o. G1P1001 who is currently not pregnant presenting to [facility] .   I was called for a consult regarding the care of this patient by the PA caring for the patient.   The provider had the following clinical question: Stopping bleeding after 14 days  The provider presented the following relevant clinical information: Stable Hgb > 12  I performed a chart review on the patient and reviewed available documentation.  BP (!) 157/115 (BP Location: Right Arm)   Pulse 99   Temp 99.2 F (37.3 C) (Oral)   Resp 16   Ht 5\' 1"  (1.549 m)   Wt 74.8 kg   SpO2 99%   BMI 31.16 kg/m   Exam- performed by consulting provider   Recommendations:  -Megace 40 mg bid until bleeding stops -Recommended MD/APP provide the patient with a [facility]) for follow up in  4 weeks.   Thank you for this consult and if additional recommendations are needed please call [phone] for the OB/GYN attending on service at [facility].   I spent approximately 10 minutes directly consulting with the provider and verbally discussing this case. Additionally 10 minutes minutes was spent performing chart review and documentation.    Reva Bores, MD

## 2023-01-23 LAB — GC/CHLAMYDIA PROBE AMP (~~LOC~~) NOT AT ARMC
Chlamydia: NEGATIVE
Comment: NEGATIVE
Comment: NORMAL
Neisseria Gonorrhea: NEGATIVE

## 2023-02-17 ENCOUNTER — Encounter: Payer: Self-pay | Admitting: Adult Health

## 2023-10-14 ENCOUNTER — Other Ambulatory Visit: Payer: Self-pay

## 2023-10-14 ENCOUNTER — Emergency Department (HOSPITAL_COMMUNITY): Payer: Self-pay

## 2023-10-14 ENCOUNTER — Emergency Department (HOSPITAL_COMMUNITY)
Admission: EM | Admit: 2023-10-14 | Discharge: 2023-10-14 | Disposition: A | Discharge status: Home / Self Care | Payer: Self-pay

## 2023-10-14 DIAGNOSIS — M5431 Sciatica, right side: Secondary | ICD-10-CM

## 2023-10-14 DIAGNOSIS — M5442 Lumbago with sciatica, left side: Secondary | ICD-10-CM | POA: Insufficient documentation

## 2023-10-14 DIAGNOSIS — Y990 Civilian activity done for income or pay: Secondary | ICD-10-CM | POA: Insufficient documentation

## 2023-10-14 DIAGNOSIS — X500XXA Overexertion from strenuous movement or load, initial encounter: Secondary | ICD-10-CM | POA: Insufficient documentation

## 2023-10-14 DIAGNOSIS — M5441 Lumbago with sciatica, right side: Secondary | ICD-10-CM | POA: Insufficient documentation

## 2023-10-14 DIAGNOSIS — Z7982 Long term (current) use of aspirin: Secondary | ICD-10-CM | POA: Insufficient documentation

## 2023-10-14 LAB — URINALYSIS, ROUTINE W REFLEX MICROSCOPIC
Bacteria, UA: NONE SEEN
Bilirubin Urine: NEGATIVE
Glucose, UA: NEGATIVE mg/dL
Ketones, ur: NEGATIVE mg/dL
Leukocytes,Ua: NEGATIVE
Nitrite: NEGATIVE
Protein, ur: NEGATIVE mg/dL
Specific Gravity, Urine: 1.001 — ABNORMAL LOW (ref 1.005–1.030)
pH: 6 (ref 5.0–8.0)

## 2023-10-14 LAB — PREGNANCY, URINE: Preg Test, Ur: NEGATIVE

## 2023-10-14 MED ORDER — NAPROXEN 500 MG PO TABS: 500.0000 mg | ORAL_TABLET | Freq: Two times a day (BID) | ORAL | 0 refills | Status: AC

## 2023-10-14 MED ORDER — OXYCODONE-ACETAMINOPHEN 5-325 MG PO TABS
1.0000 | ORAL_TABLET | Freq: Once | ORAL | Status: AC
  Administered 2023-10-14: 1 via ORAL
  Filled 2023-10-14: qty 1

## 2023-10-14 MED ORDER — METHOCARBAMOL 500 MG PO TABS: 500.0000 mg | ORAL_TABLET | Freq: Two times a day (BID) | ORAL | 0 refills | Status: AC

## 2023-10-14 NOTE — ED Triage Notes (Signed)
 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

## 2023-10-14 NOTE — ED Provider Notes (Signed)
 [place] EMERGENCY DEPARTMENT AT [facility] Provider Note   CSN: [id number] Arrival date & time: 10/14/23  1520     History  Chief Complaint  Patient presents with   Back Pain    Alexandria Bradshaw is a 33 y.o. female.  33 year old female with no reported past medical history presenting to the emergency department today with low back pain.  Patient states that she was doing some heavy lifting at work 2 days ago when she noticed pain.  She states that this was tolerable but she states that she twisted 2 days ago and thinks that she worsen things.  The patient denies any weakness in her legs.  States that the pain does go down her legs.  She denies any focal weakness, numbness, tingling, bowel or bladder dysfunction, or saddle anesthesia.  She denies any history of IV drug abuse.  Denies taking any blood thinners.  She came to the ER today for further evaluation regarding this.   Back Pain      Home Medications Prior to Admission medications   Medication Sig Start Date End Date Taking? Authorizing Provider  methocarbamol  (ROBAXIN ) 500 MG tablet Take 1 tablet (500 mg total) by mouth 2 (two) times daily. 10/14/23  Yes Carin Charleston, MD  naproxen  (NAPROSYN ) 500 MG tablet Take 1 tablet (500 mg total) by mouth 2 (two) times daily. 10/14/23  Yes Carin Charleston, MD  acetaminophen  (TYLENOL ) 325 MG tablet Take 650 mg by mouth every 6 (six) hours as needed.    [provider]  Aspirin-Acetaminophen -Caffeine (GOODYS EXTRA STRENGTH PO) Take 1 Package by mouth daily as needed (pain).    [provider]  megestrol  (MEGACE ) 40 MG tablet Take 1 tablet (40 mg total) by mouth 2 (two) times daily. Take 40 mg by mouth twice daily until bleeding stops 01/15/23   Baxter Limber A, PA-C  metroNIDAZOLE  (FLAGYL ) 500 MG tablet Take 1 tablet (500 mg total) by mouth 2 (two) times daily. 01/15/23   Baxter Limber A, PA-C      Allergies    No known allergies    Review of Systems    Review of Systems  Musculoskeletal:  Positive for back pain.  All other systems reviewed and are negative.   Physical Exam Updated Vital Signs BP 135/75   Pulse 95   Temp 98.5 F (36.9 C)   Resp 18   SpO2 98%  Physical Exam Vitals and nursing note reviewed.   Gen: NAD Eyes: PERRL, EOMI HEENT: no oropharyngeal swelling Neck: trachea midline Resp: clear to auscultation bilaterally Card: RRR, no murmurs, rubs, or gallops Abd: nontender, nondistended Extremities: no calf tenderness, no edema MSK: The patient is tender over the mid lumbar spine with right and left paraspinal tenderness noted, no step-offs or deformities noted, positive straight leg raise with right greater than left Neuro: The patient has equal strength and sensation throughout bilateral lower extremities with normal patellar and Achilles reflexes bilaterally Vascular: 2+ radial pulses bilaterally, 2+ DP pulses bilaterally Skin: no rashes Psyc: acting appropriately   ED Results / Procedures / Treatments   Labs (all labs ordered are listed, but only abnormal results are displayed) Labs Reviewed  URINALYSIS, ROUTINE W REFLEX MICROSCOPIC - Abnormal; Notable for the following components:      Result Value   Color, Urine COLORLESS (*)    Specific Gravity, Urine 1.001 (*)    Hgb urine dipstick SMALL (*)    All other components within normal limits  PREGNANCY, URINE    EKG None  Radiology DG Lumbar Spine Complete Result Date: 10/14/2023 CLINICAL DATA:  Low back pain x2 days. Possible lifting injury. Numbness in the extremities. EXAM: LUMBAR SPINE - COMPLETE 4+ VIEW COMPARISON:  None Available. FINDINGS: The routine five views were obtained. There is no evidence of lumbar spine fracture. Alignment is normal apart from a minimal levorotary scoliosis apex L3. Intervertebral disc spaces are maintained. IMPRESSION: Minimal levorotary scoliosis. No evidence of fracture or listhesis. Electronically Signed   By: Denman Fischer M.D.   On: 10/14/2023 20:03    Procedures Procedures    Medications Ordered in ED Medications  oxyCODONE -acetaminophen  (PERCOCET/ROXICET) 5-325 MG per tablet 1 tablet (1 tablet Oral Given 10/14/23 1814)    ED Course/ Medical Decision Making/ A&P                                 Medical Decision Making 33 year old female with no reported past medical history presenting to the emergency department today with low back pain.  Will further evaluate her here with a urinalysis and urine hCG.  Will also obtain an x-ray to evaluate for acute bony abnormalities.  I will give Percocet for pain.  Suspect that this is likely due to sciatica or muscle strain.  She does not have any red flag symptoms for cauda equina syndrome or cord compressing lesion at this time.  I will reevaluate for ultimate disposition.  The patient's x-ray showed some scoliosis but no acute findings.  Urinalysis is unremarkable.  The patient is discharged with return precaution and is amatory with steady gait.  Amount and/or Complexity of Data Reviewed Labs: ordered. Radiology: ordered.  Risk Prescription drug management.           Final Clinical Impression(s) / ED Diagnoses Final diagnoses:  Bilateral sciatica    Rx / DC Orders ED Discharge Orders          Ordered    naproxen  (NAPROSYN ) 500 MG tablet  2 times daily        10/14/23 2016    methocarbamol  (ROBAXIN ) 500 MG tablet  2 times daily        10/14/23 2016              Carin Charleston, MD 10/14/23 2017

## 2023-10-14 NOTE — Discharge Instructions (Signed)
 Your x-rays and urine were reassuring.  Please take the naproxen  twice daily.  You may take the methocarbamol  which is a muscle relaxer as needed.  Do not drive or drink alcohol while taking this as it may make you drowsy.  Please follow-up with your doctor for reevaluation and return to the ER for worsening symptoms.

## 2023-10-17 ENCOUNTER — Encounter (HOSPITAL_COMMUNITY): Payer: Self-pay

## 2023-10-17 ENCOUNTER — Other Ambulatory Visit: Payer: Self-pay

## 2023-10-17 ENCOUNTER — Emergency Department (HOSPITAL_COMMUNITY)
Admission: EM | Admit: 2023-10-17 | Discharge: 2023-10-17 | Discharge status: Against Medical Advice | Payer: Self-pay | Attending: Emergency Medicine | Admitting: Emergency Medicine

## 2023-10-17 DIAGNOSIS — Z5321 Procedure and treatment not carried out due to patient leaving prior to being seen by health care provider: Secondary | ICD-10-CM | POA: Insufficient documentation

## 2023-10-17 DIAGNOSIS — M545 Low back pain, unspecified: Secondary | ICD-10-CM | POA: Insufficient documentation

## 2023-10-17 NOTE — ED Triage Notes (Signed)
 Pt arrived via [facility] from home c/o on-going lower back pain and reports being seen here recently for same. Pt reports prescribed medications are not helping.

## 2023-10-17 NOTE — ED Notes (Signed)
 Multiple ED Staff have attempted to locate Pt. Pt no longer in [facility].
